# Patient Record
Sex: Female | Born: 1935 | Race: White | Hispanic: No | Marital: Married
Health system: Southern US, Community
[De-identification: ages and names within clinical notes are randomized; demographics above are authoritative.]

---

## 1998-03-24 ENCOUNTER — Ambulatory Visit (HOSPITAL_COMMUNITY): Admission: RE | Admit: 1998-03-24 | Discharge: 1998-03-24 | Payer: Self-pay | Admitting: Orthopedic Surgery

## 1998-03-24 ENCOUNTER — Encounter: Payer: Self-pay | Admitting: Orthopedic Surgery

## 1999-07-14 ENCOUNTER — Other Ambulatory Visit: Admission: RE | Admit: 1999-07-14 | Discharge: 1999-07-14 | Payer: Self-pay | Admitting: Geriatric Medicine

## 1999-09-30 ENCOUNTER — Encounter: Payer: Self-pay | Admitting: Orthopedic Surgery

## 1999-09-30 ENCOUNTER — Encounter: Admission: RE | Admit: 1999-09-30 | Discharge: 1999-09-30 | Payer: Self-pay | Admitting: Internal Medicine

## 1999-10-01 ENCOUNTER — Ambulatory Visit (HOSPITAL_BASED_OUTPATIENT_CLINIC_OR_DEPARTMENT_OTHER): Admission: RE | Admit: 1999-10-01 | Discharge: 1999-10-02 | Payer: Self-pay | Admitting: Orthopedic Surgery

## 1999-10-28 ENCOUNTER — Encounter: Admission: RE | Admit: 1999-10-28 | Discharge: 2000-01-26 | Payer: Self-pay | Admitting: Orthopedic Surgery

## 2000-02-10 ENCOUNTER — Ambulatory Visit (HOSPITAL_BASED_OUTPATIENT_CLINIC_OR_DEPARTMENT_OTHER): Admission: RE | Admit: 2000-02-10 | Discharge: 2000-02-11 | Payer: Self-pay | Admitting: Orthopedic Surgery

## 2000-06-06 ENCOUNTER — Encounter (INDEPENDENT_AMBULATORY_CARE_PROVIDER_SITE_OTHER): Payer: Self-pay | Admitting: *Deleted

## 2000-06-06 ENCOUNTER — Ambulatory Visit (HOSPITAL_COMMUNITY): Admission: RE | Admit: 2000-06-06 | Discharge: 2000-06-06 | Payer: Self-pay | Admitting: Gastroenterology

## 2000-10-17 ENCOUNTER — Encounter: Payer: Self-pay | Admitting: Orthopedic Surgery

## 2000-10-17 ENCOUNTER — Ambulatory Visit (HOSPITAL_COMMUNITY): Admission: RE | Admit: 2000-10-17 | Discharge: 2000-10-17 | Payer: Self-pay | Admitting: Orthopedic Surgery

## 2000-10-31 ENCOUNTER — Ambulatory Visit (HOSPITAL_COMMUNITY): Admission: RE | Admit: 2000-10-31 | Discharge: 2000-10-31 | Payer: Self-pay | Admitting: Orthopedic Surgery

## 2000-10-31 ENCOUNTER — Encounter: Payer: Self-pay | Admitting: Orthopedic Surgery

## 2000-12-19 ENCOUNTER — Encounter: Payer: Self-pay | Admitting: Orthopedic Surgery

## 2000-12-19 ENCOUNTER — Ambulatory Visit (HOSPITAL_COMMUNITY): Admission: RE | Admit: 2000-12-19 | Discharge: 2000-12-19 | Payer: Self-pay | Admitting: Orthopedic Surgery

## 2001-02-06 ENCOUNTER — Inpatient Hospital Stay (HOSPITAL_COMMUNITY): Admission: EM | Admit: 2001-02-06 | Discharge: 2001-02-12 | Payer: Self-pay | Admitting: Emergency Medicine

## 2001-02-06 ENCOUNTER — Encounter: Payer: Self-pay | Admitting: Emergency Medicine

## 2001-02-06 ENCOUNTER — Encounter: Payer: Self-pay | Admitting: Geriatric Medicine

## 2001-07-02 ENCOUNTER — Encounter: Payer: Self-pay | Admitting: Orthopedic Surgery

## 2001-07-02 ENCOUNTER — Ambulatory Visit (HOSPITAL_COMMUNITY): Admission: RE | Admit: 2001-07-02 | Discharge: 2001-07-02 | Payer: Self-pay | Admitting: Orthopedic Surgery

## 2002-03-12 ENCOUNTER — Encounter: Payer: Self-pay | Admitting: Orthopedic Surgery

## 2002-03-12 ENCOUNTER — Ambulatory Visit (HOSPITAL_COMMUNITY): Admission: RE | Admit: 2002-03-12 | Discharge: 2002-03-12 | Payer: Self-pay | Admitting: Orthopedic Surgery

## 2002-03-22 ENCOUNTER — Emergency Department (HOSPITAL_COMMUNITY): Admission: EM | Admit: 2002-03-22 | Discharge: 2002-03-22 | Payer: Self-pay | Admitting: Emergency Medicine

## 2002-03-22 ENCOUNTER — Encounter: Payer: Self-pay | Admitting: Emergency Medicine

## 2002-07-26 ENCOUNTER — Encounter: Payer: Self-pay | Admitting: Orthopedic Surgery

## 2002-07-26 ENCOUNTER — Ambulatory Visit (HOSPITAL_COMMUNITY): Admission: RE | Admit: 2002-07-26 | Discharge: 2002-07-26 | Payer: Self-pay | Admitting: Orthopedic Surgery

## 2002-08-21 ENCOUNTER — Ambulatory Visit (HOSPITAL_COMMUNITY): Admission: RE | Admit: 2002-08-21 | Discharge: 2002-08-21 | Payer: Self-pay | Admitting: Orthopedic Surgery

## 2002-08-21 ENCOUNTER — Encounter: Payer: Self-pay | Admitting: Orthopedic Surgery

## 2002-08-27 ENCOUNTER — Encounter (INDEPENDENT_AMBULATORY_CARE_PROVIDER_SITE_OTHER): Payer: Self-pay | Admitting: Specialist

## 2002-08-27 ENCOUNTER — Inpatient Hospital Stay (HOSPITAL_COMMUNITY): Admission: RE | Admit: 2002-08-27 | Discharge: 2002-08-28 | Payer: Self-pay | Admitting: Orthopedic Surgery

## 2005-05-12 ENCOUNTER — Inpatient Hospital Stay (HOSPITAL_COMMUNITY): Admission: EM | Admit: 2005-05-12 | Discharge: 2005-05-27 | Payer: Self-pay | Admitting: Emergency Medicine

## 2005-05-17 ENCOUNTER — Encounter (INDEPENDENT_AMBULATORY_CARE_PROVIDER_SITE_OTHER): Payer: Self-pay | Admitting: Cardiology

## 2005-10-05 ENCOUNTER — Inpatient Hospital Stay (HOSPITAL_COMMUNITY): Admission: EM | Admit: 2005-10-05 | Discharge: 2005-10-08 | Payer: Self-pay | Admitting: Emergency Medicine

## 2006-06-16 ENCOUNTER — Encounter: Admission: RE | Admit: 2006-06-16 | Discharge: 2006-06-16 | Payer: Self-pay | Admitting: Geriatric Medicine

## 2006-10-08 ENCOUNTER — Ambulatory Visit: Payer: Self-pay | Admitting: Pulmonary Disease

## 2006-10-08 ENCOUNTER — Inpatient Hospital Stay (HOSPITAL_COMMUNITY): Admission: EM | Admit: 2006-10-08 | Discharge: 2006-10-31 | Payer: Self-pay | Admitting: Emergency Medicine

## 2006-10-13 ENCOUNTER — Encounter (INDEPENDENT_AMBULATORY_CARE_PROVIDER_SITE_OTHER): Payer: Self-pay | Admitting: Interventional Radiology

## 2006-10-17 ENCOUNTER — Encounter (INDEPENDENT_AMBULATORY_CARE_PROVIDER_SITE_OTHER): Payer: Self-pay | Admitting: Interventional Radiology

## 2007-01-19 ENCOUNTER — Inpatient Hospital Stay (HOSPITAL_COMMUNITY): Admission: EM | Admit: 2007-01-19 | Discharge: 2007-01-29 | Payer: Self-pay | Admitting: Emergency Medicine

## 2007-01-25 ENCOUNTER — Encounter: Payer: Self-pay | Admitting: Orthopedic Surgery

## 2007-01-26 ENCOUNTER — Ambulatory Visit: Payer: Self-pay | Admitting: Physical Medicine & Rehabilitation

## 2007-02-04 ENCOUNTER — Inpatient Hospital Stay (HOSPITAL_COMMUNITY): Admission: EM | Admit: 2007-02-04 | Discharge: 2007-02-15 | Payer: Self-pay | Admitting: Emergency Medicine

## 2007-02-26 ENCOUNTER — Inpatient Hospital Stay (HOSPITAL_COMMUNITY): Admission: EM | Admit: 2007-02-26 | Discharge: 2007-02-28 | Payer: Self-pay | Admitting: Emergency Medicine

## 2007-02-26 ENCOUNTER — Ambulatory Visit (HOSPITAL_COMMUNITY): Admission: RE | Admit: 2007-02-26 | Discharge: 2007-02-26 | Payer: Self-pay | Admitting: Internal Medicine

## 2007-02-26 ENCOUNTER — Ambulatory Visit: Payer: Self-pay | Admitting: Critical Care Medicine

## 2009-08-12 IMAGING — CR DG ELBOW 2V*R*
2 series · 2 of 2 positions shown · non-contrast
Comparison: none

CLINICAL DATA: Status post fall.  Elbow swelling.  Previous humerus surgery 4 years ago.  
RIGHT ELBOW ? 2 VIEW:

[view not recorded (1 of 2)]
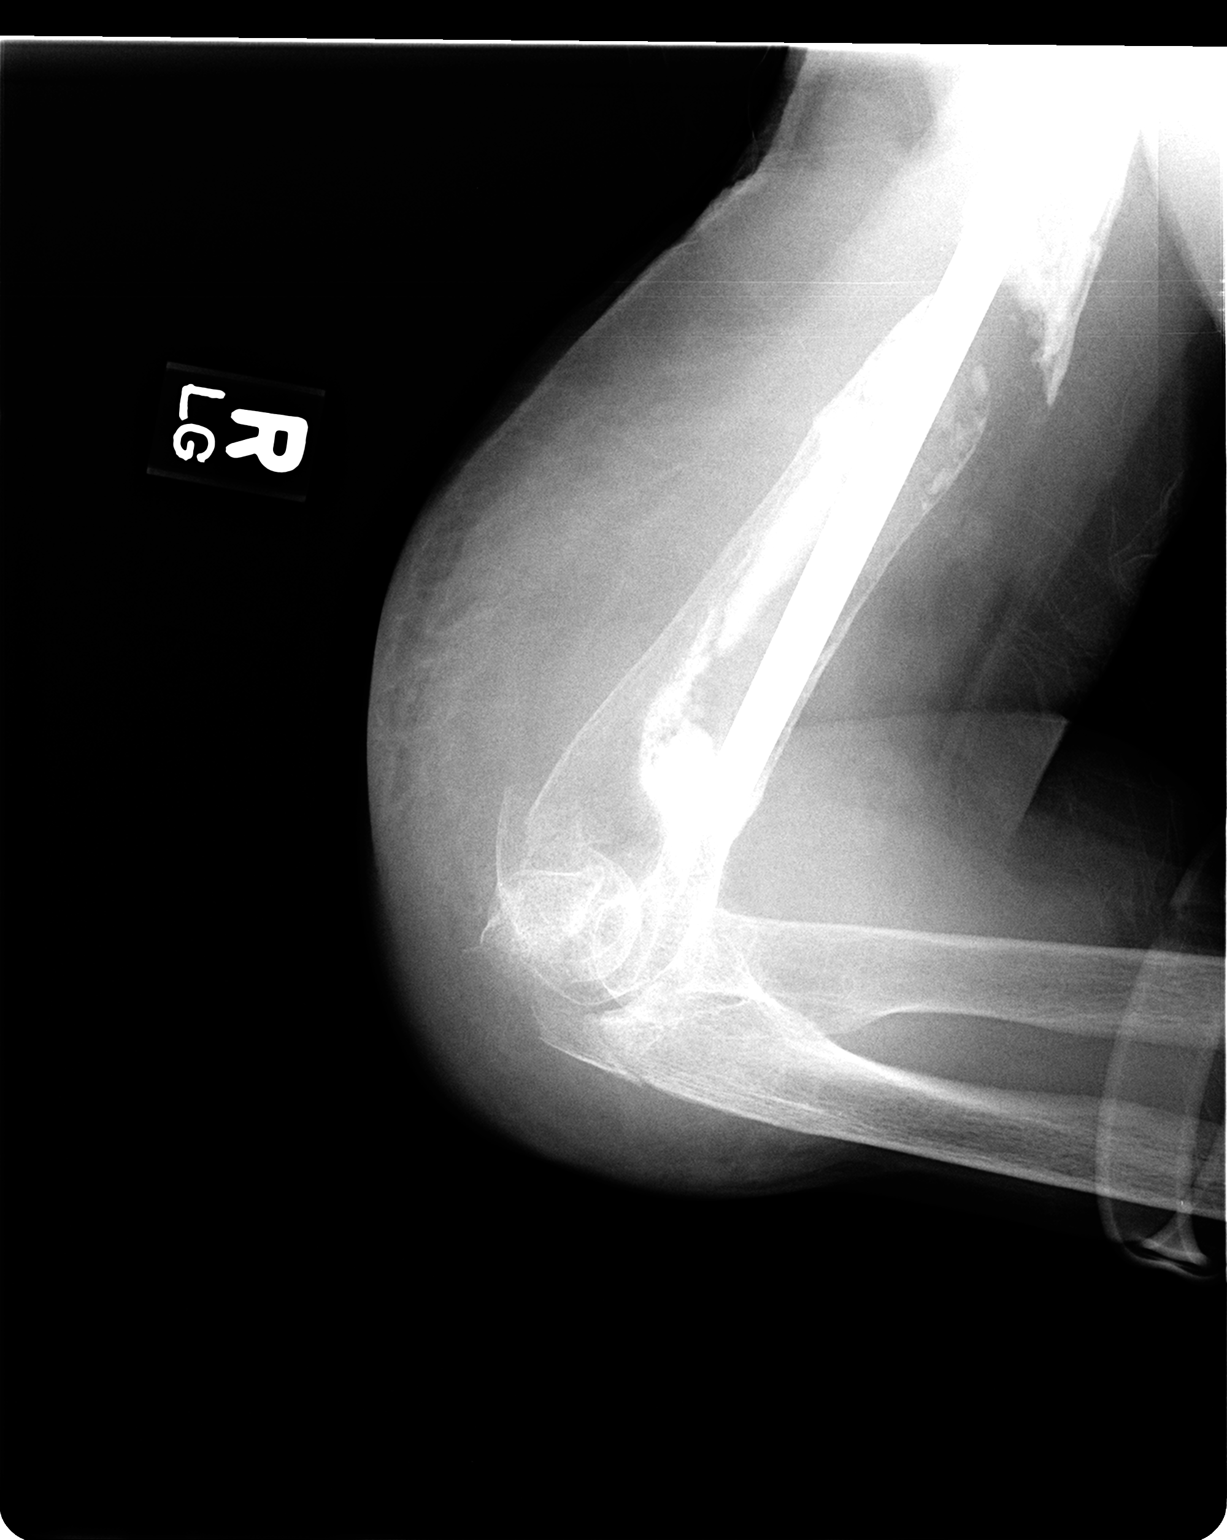

[view not recorded (2 of 2)]
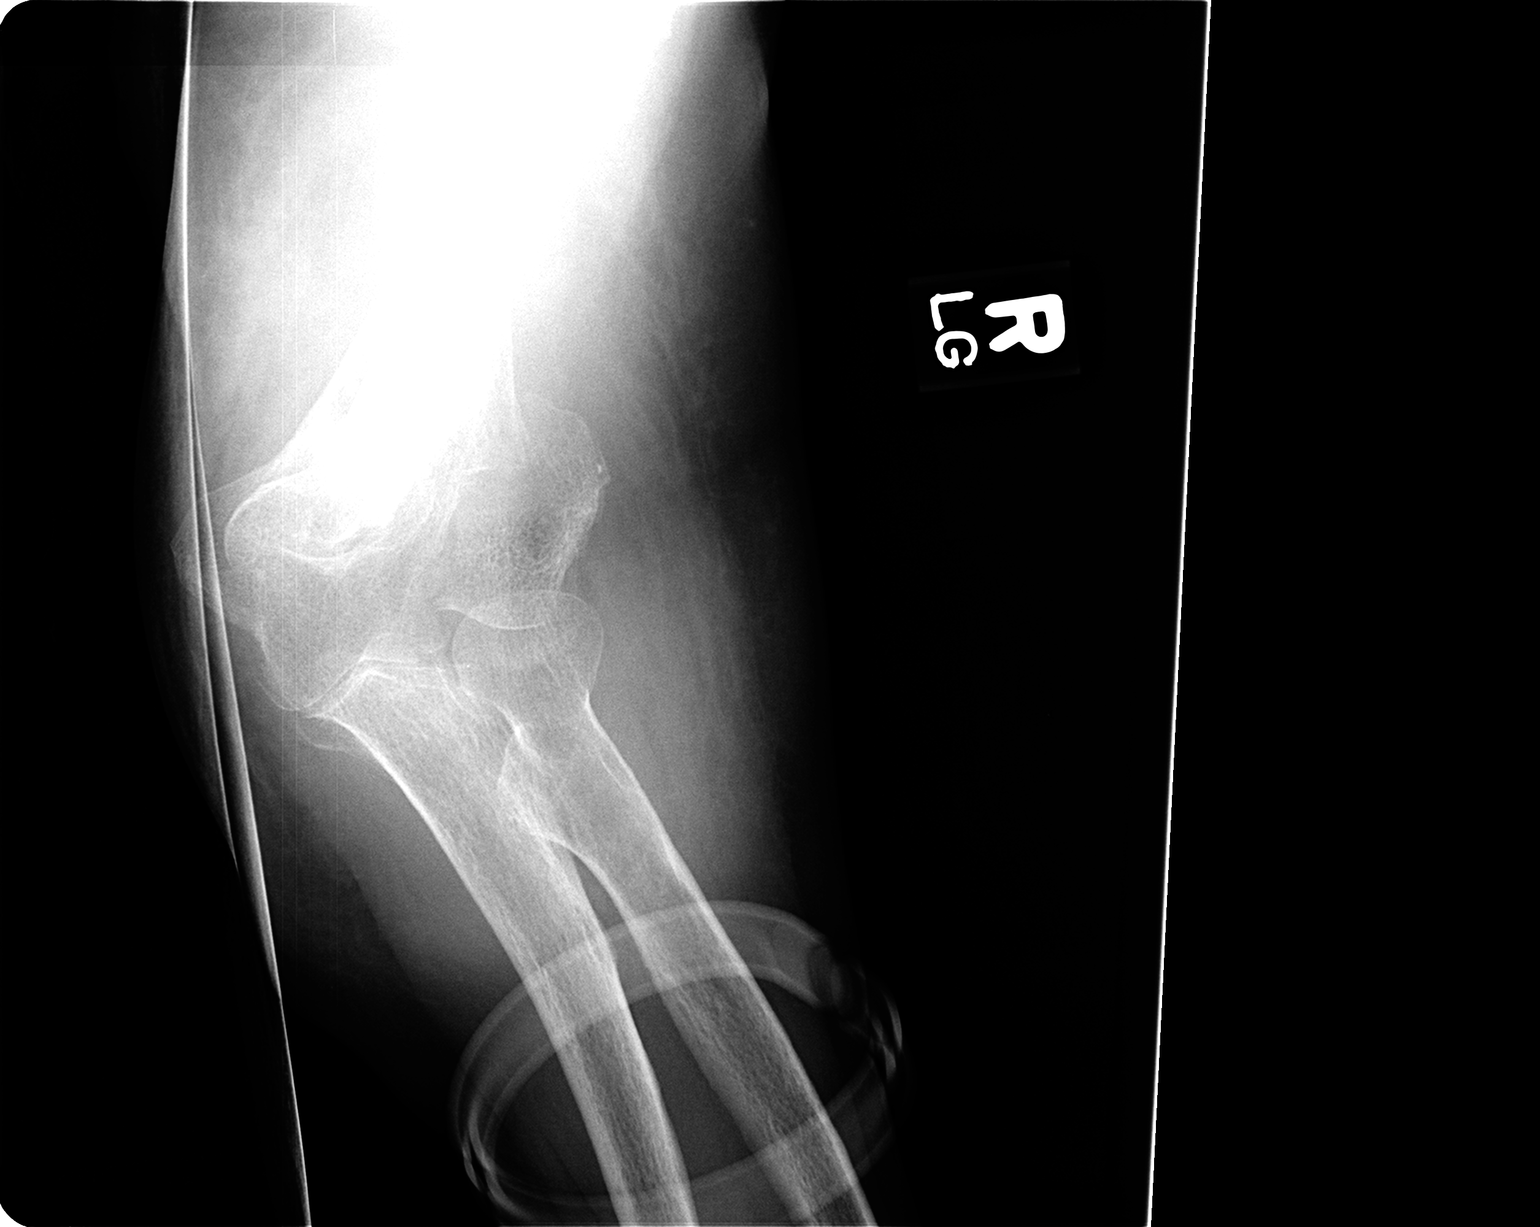

[2 of 2 positions shown; findings below may reference images not displayed]

FINDINGS: The examination is limited by the patient?s injury.  There has been previous right total shoulder replacement.  There is a long humeral shaft extending nearly to the elbow.  An insufficiency or pathologic fracture of the mid humerus is partially imaged.  There is marked osseous lucency surrounding the distal aspect of the humeral prosthesis.  There is also an acute fracture of the distal humerus which is not optimally evaluated, but likely has intraarticular components.  There is also a nondisplaced fracture of the olecranon process of the ulna.  The proximal radius appears intact.  There is marked soft tissue swelling.
IMPRESSION: 1.  Intraarticular fractures of the distal humerus and proximal ulna as described. 
2.  In addition, there is a pathologic fracture of the mid right humerus with marked lysis surrounding the humeral component of the patient?s right total shoulder prosthesis.  
3.  The proximal abnormalities are incompletely imaged on this examination.  Shoulder and humeral radiographs are recommended for complete assessment.

## 2009-08-14 IMAGING — CR DG CHEST 1V PORT
1 series · 1 of 1 positions shown · non-contrast
Comparison: Comparison is made with earlier today.

CLINICAL DATA: PICC line placement.
 PORTABLE CHEST - 1 VIEW ? 01/20/07 AT 2444 HOURS:

[view not recorded]
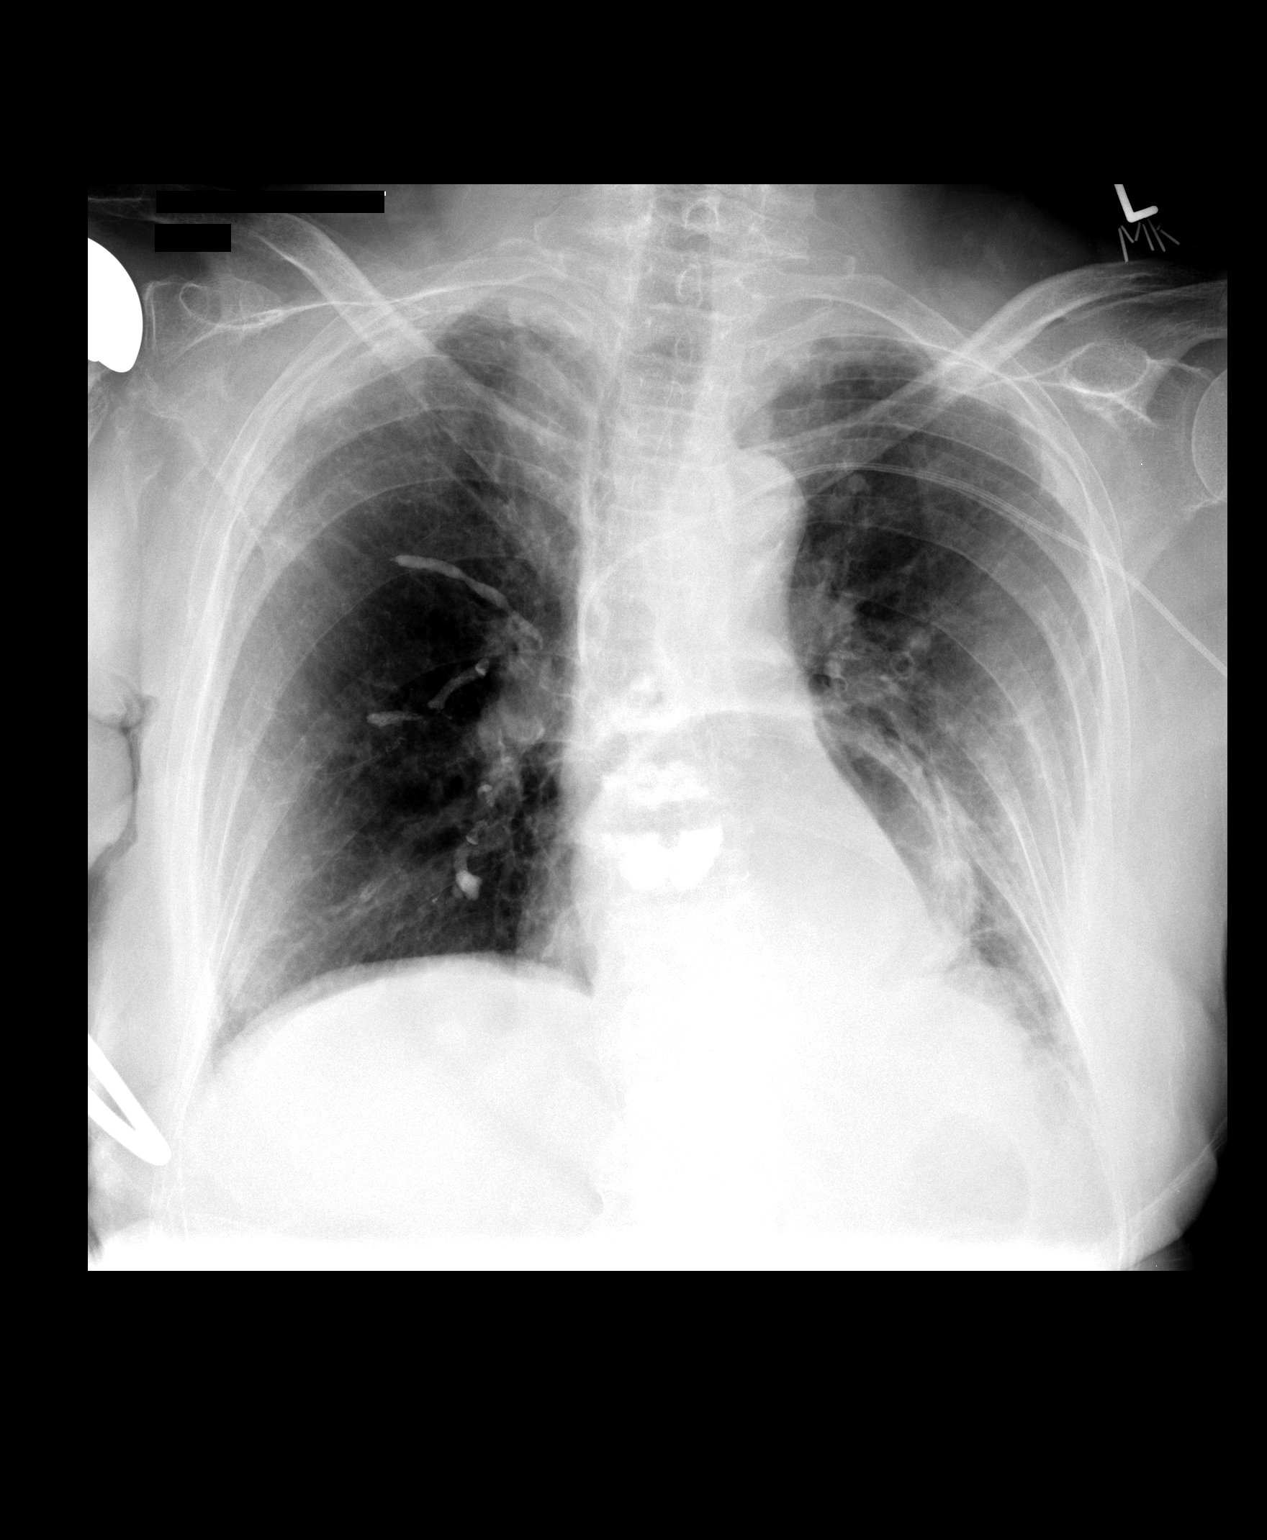

[1 of 1 positions shown; findings below may reference images not displayed]

FINDINGS: The PICC line has been adjusted with the tip now in the SVC in good position.  A large hiatal hernia is noted. The lungs remain clear without acute infiltrate.  There remains some mild left lower lobe atelectasis.
IMPRESSION: The PICC line is now in good position in the SVC.

## 2009-08-15 IMAGING — RF DG ELBOW COMPLETE 3+V*R*
1 series · 5 of 5 positions shown · non-contrast
Comparison: Right humerus x-rays 01/18/2007.

CLINICAL DATA: Right humerus fractures. Previous right shoulder arthroplasty.

INTRAOPERATIVE RIGHT ELBOW - 2 VIEW  01/21/2007:

[Series 1: run · 5 of 5 slices shown]
[im 1/5]
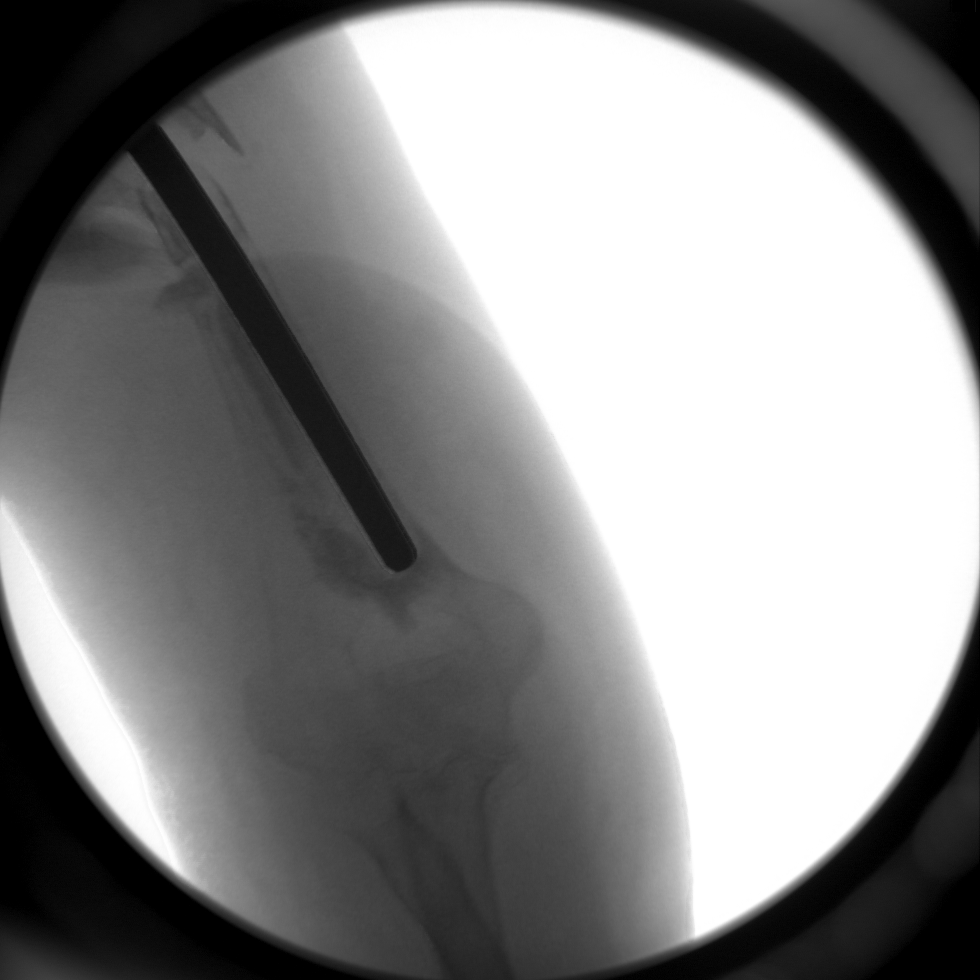
[im 2/5]
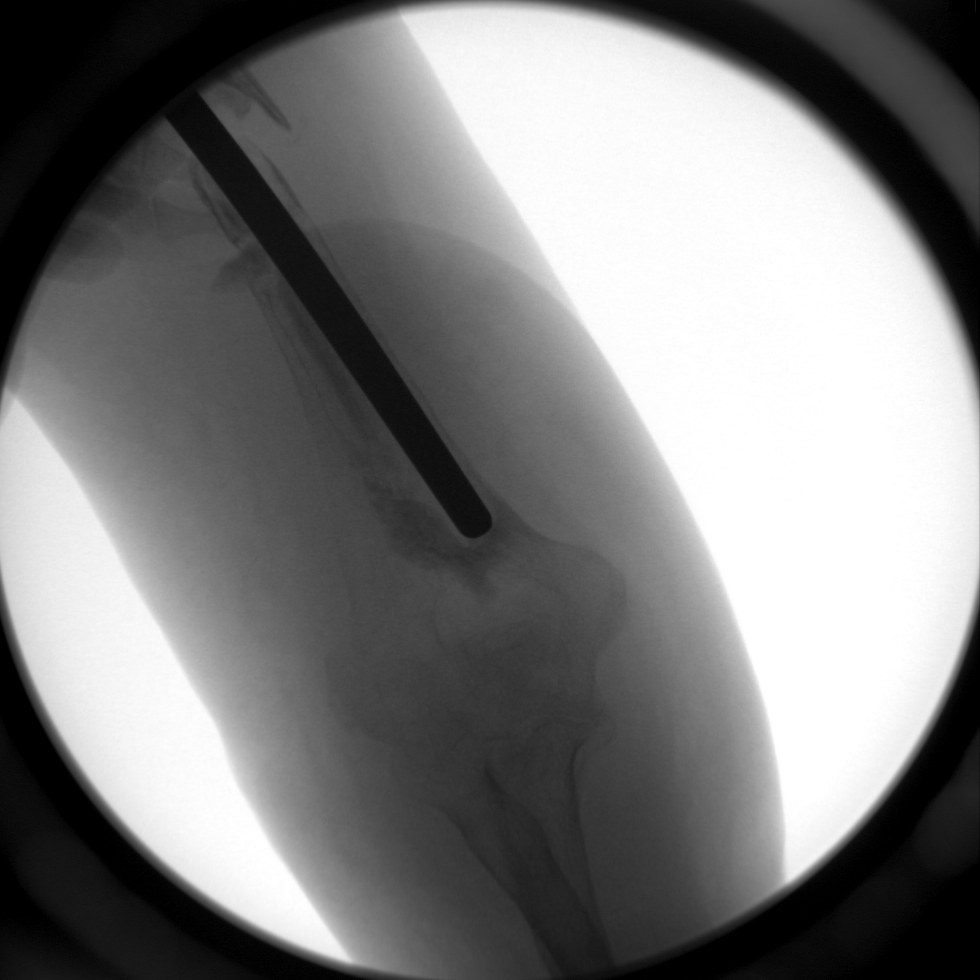
[im 3/5]
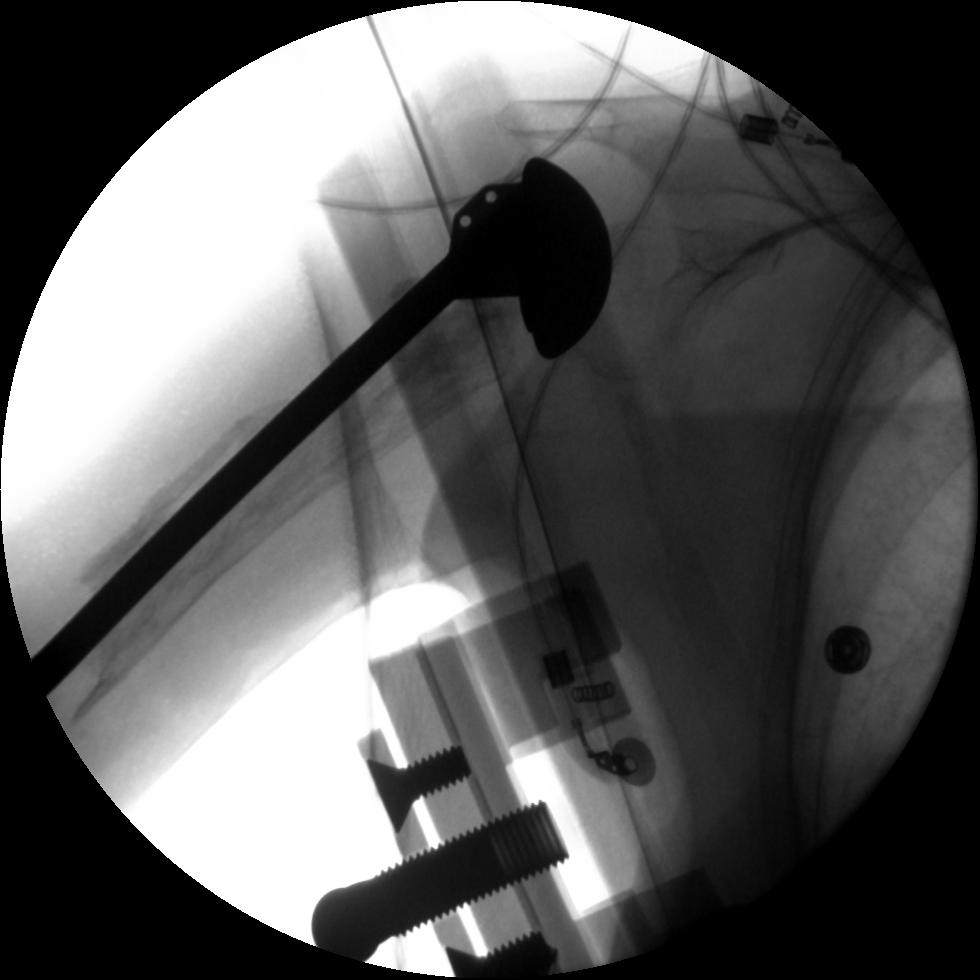
[im 4/5]
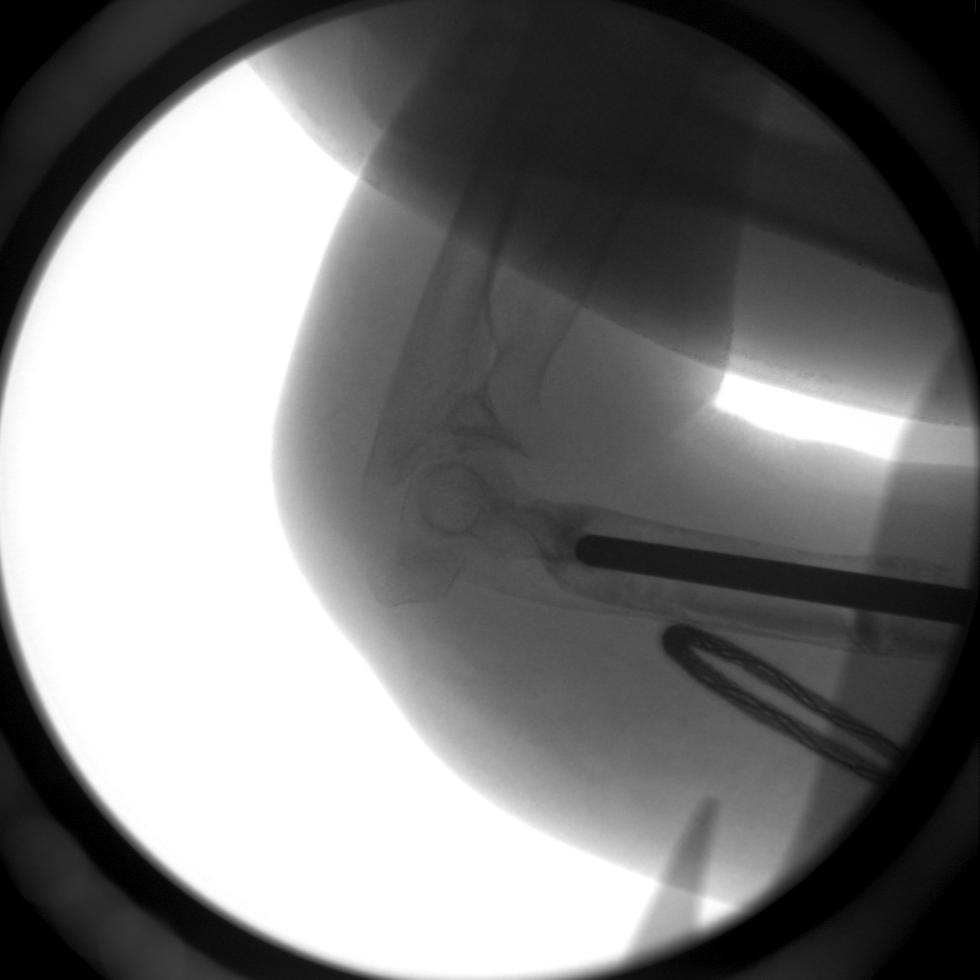
[im 5/5]
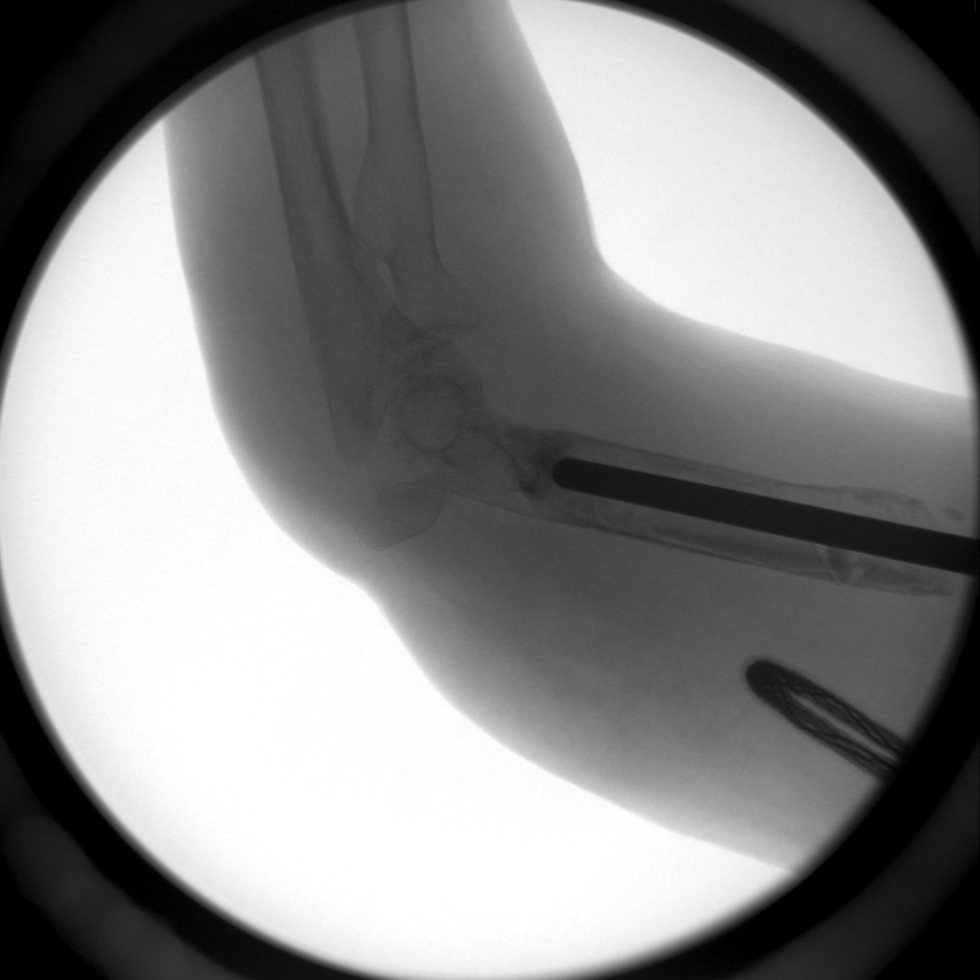

[5 of 5 positions shown; findings below may reference images not displayed]

FINDINGS: Five spot images from the C-arm fluoroscopic device submitted for
interpretation post-operatively. AP and lateral views of the elbow with single
image of the entire humerus demonstrating prior shoulder arthroplasty. Fracture
involving the humerus mid shaft with distraction of the fragments as noted
previously. Olecranon fracture with rotation of the proximal fragment and slight
displacement, the displacement more prominent than on the x-rays 3 days ago.
Distal humerus fracture involving the lateral condyle again noted.
IMPRESSION: Mid shaft humerus fracture, distal humerus fracture involving the lateral
condyle, and olecranon fracture as noted previously. Further distraction of the
olecranon fracture fragments since the exam 3 days ago.

## 2009-08-18 IMAGING — CR DG KNEE 1-2V*R*
2 series · 2 of 2 positions shown · non-contrast
Comparison: none

HISTORY: Right knee pain, recent fall

RIGHT KNEE 2 VIEWS:
Diffuse bony demineralization.
Medial compartment joint space narrowing with perforation.
Soft tissue swelling medially at right knee.
No fracture or dislocation.
Suspect knee joint effusion on slightly rotated lateral view.
Minimal chondrocalcinosis, question CPPD.

[view not recorded (1 of 2)]
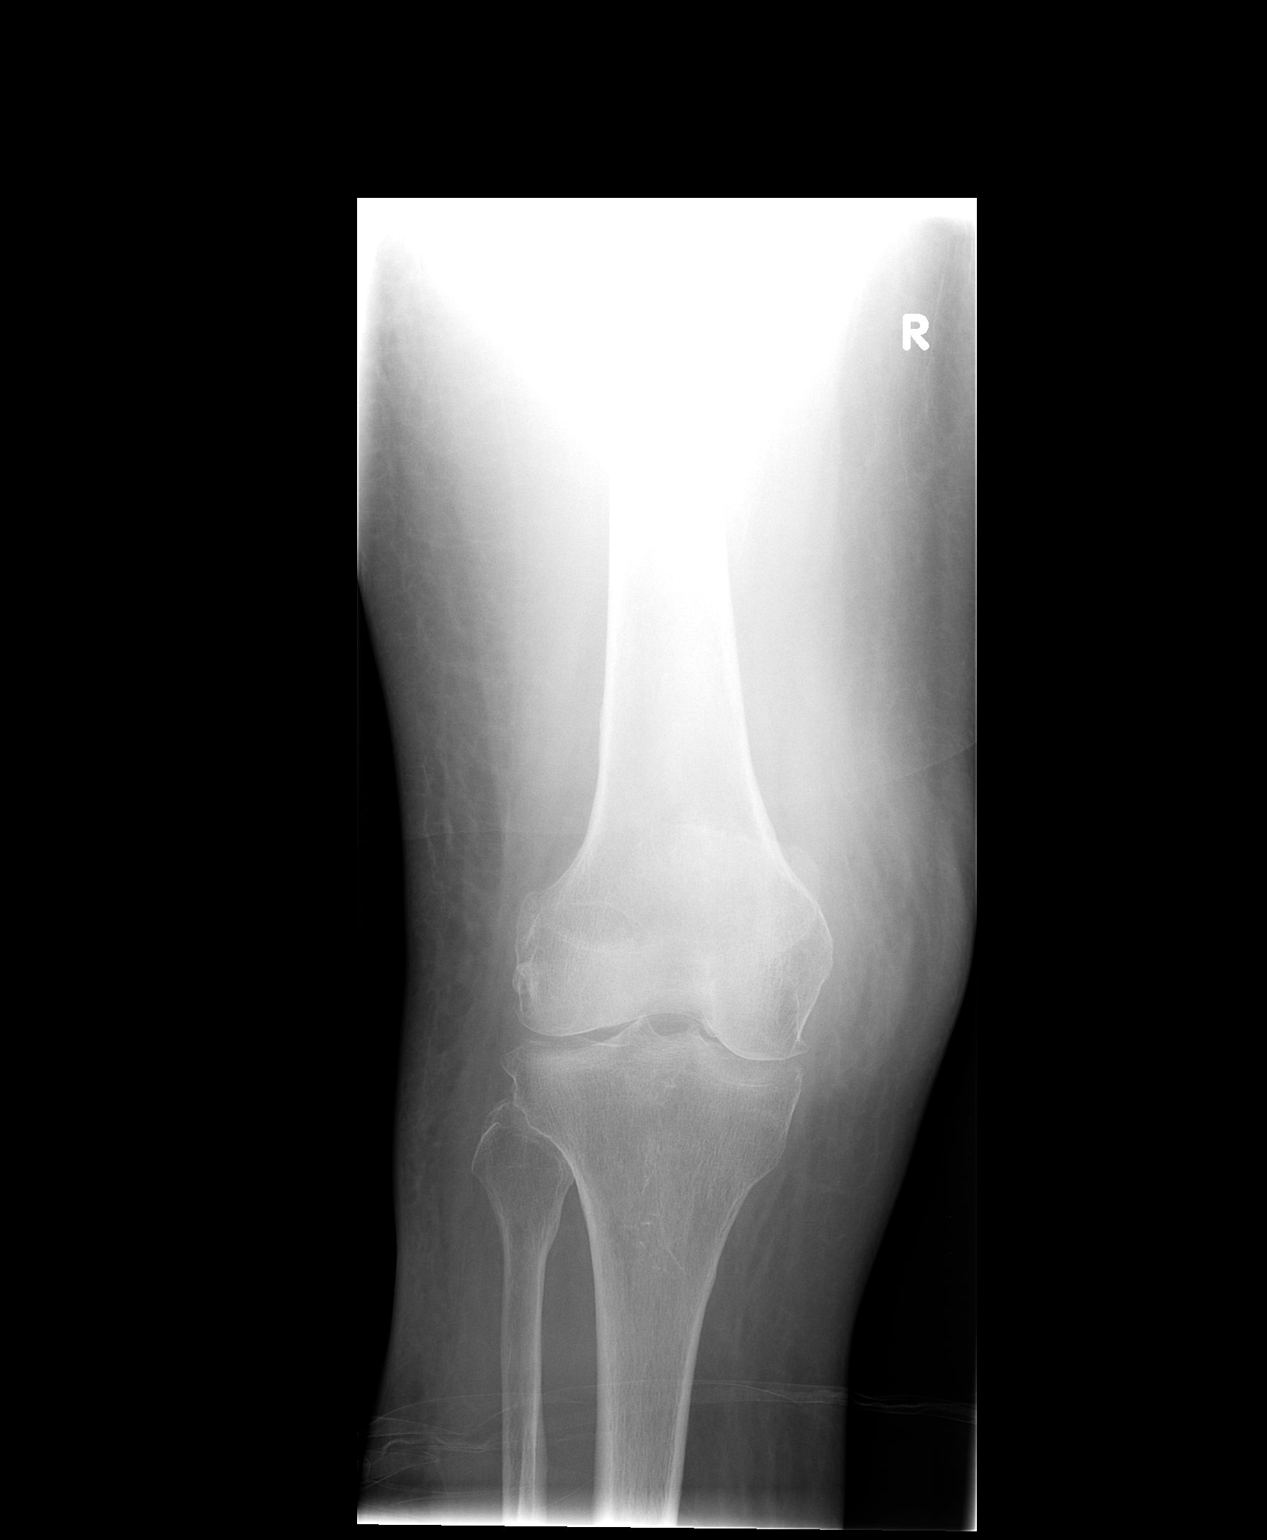

[view not recorded (2 of 2)]
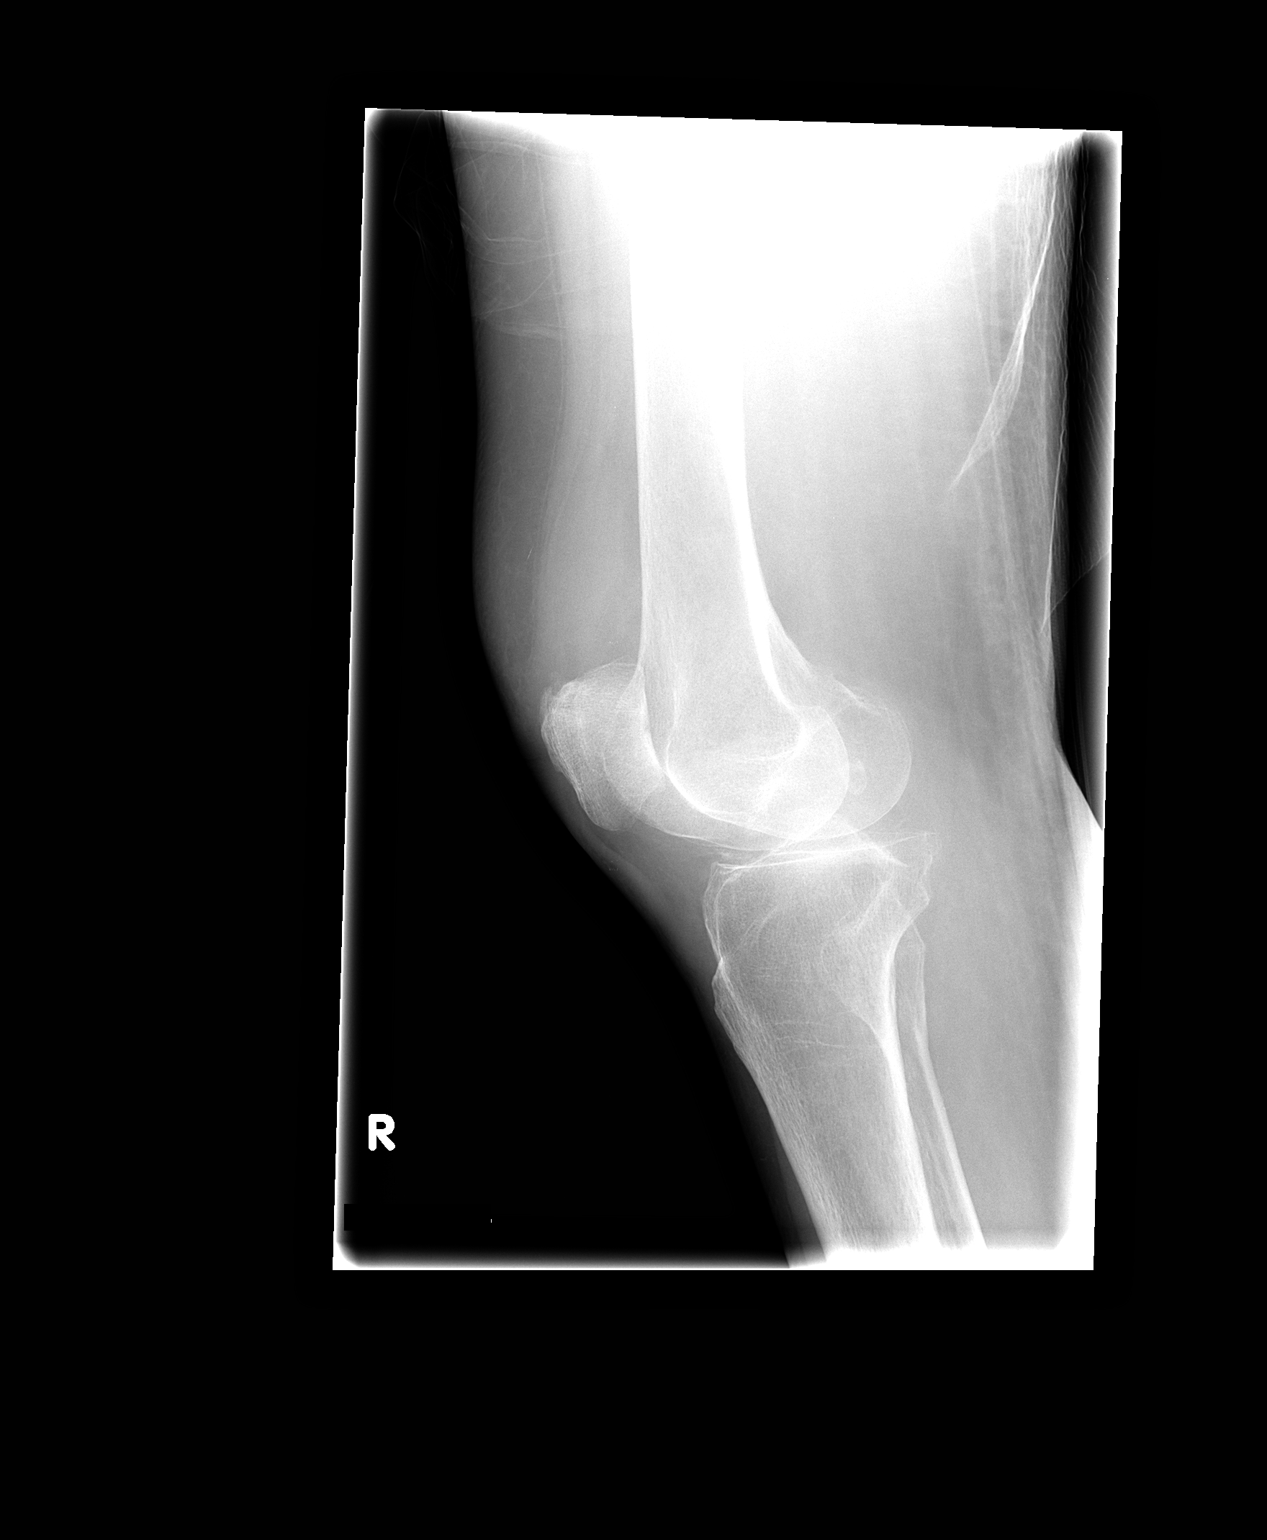

[2 of 2 positions shown; findings below may reference images not displayed]

IMPRESSION: Degenerative changes with probable knee joint effusion.
Diffuse bony demineralization.
No definite acute bony abnormalities.

## 2010-08-17 NOTE — Consult Note (Signed)
NAME:  [name], [name]                 ACCOUNT NO.:  [id number]   MEDICAL RECORD NO.:  [id number]          PATIENT TYPE:  INP   LOCATION:  5020                         FACILITY:  [facility]   PHYSICIAN:  Doralee Albino. Carola Frost, M.D. DATE OF BIRTH:  [birth year]   DATE OF CONSULTATION:  01/22/2007  DATE OF DISCHARGE:                                 CONSULTATION   REQUESTING PHYSICIAN:  Elana Alm. Thurston Hole, M.D.   REASON FOR CONSULTATION REQUEST:  Complex right periarticular elbow  fracture.   BRIEF HISTORY OF PRESENTATION:  [name] [name] is an 77 year old female who  has history of multiple medical problems that sustained a fall from her  rehab facility resulting in right elbow and right hip pain.  She  underwent delayed internal fixation by Dr. Thurston Hole of her hip, and splint  was applied to the elbow.  Given the complexity of the right elbow  situation.  He did ask me to consult regarding evaluation and possible  definitive management.  The patient is status post a right shoulder  fracture dislocation treated with hemiarthroplasty, complicated by a  subsequent fracture the tip of the stem.  This was revised to a long  stem when that fracture developed into a nonunion.  The nonunion did not  resolve, and she developed lucency around the distal prosthesis.  She  sustained a fracture of her lateral condyle in addition to the olecranon  as visualized on the CT scan obtained by Dr. Thurston Hole.  At this time she  denies any numbness or tingling in her fingers, and denies any wrist  pain.   PAST MEDICAL HISTORY:  1. Lower extremity DVT.  2. T12 compression fracture.  3. Hemothorax.  4. Supratherapeutic INR.  5. Paroxysmal SVT.  6. Thrombocytosis.  7. Chronic back pain.  8. Multiple falls.  9. Hypothyroidism.  10.GERD.  11.CVA.  12.Asthma.  13.Osteoporosis.  14.Upper GI bleed.  15.T&A.  16.Hypertension.  17.Status post vertebroplasty.  18.Total abdominal hysterectomy.  19.Exploratory laparotomy.   MEDICATIONS:  Vicodin, metoprolol, calcium, MiraLax, Tylenol, Xopenex,  Coumadin,  Phenergan, Senokot, Prilosec, levothyroxine, simvastatin,  Ativan, morphine ER, gabapentin, and Cymbalta.   SOCIAL HISTORY:  The patient lives at [facility].  Her  husband has Alzheimer's.  She has two sons.   FAMILY MEDICAL HISTORY:  Noncontributory.   REVIEW OF SYSTEMS:  As above.   PHYSICAL EXAMINATION:  GENERAL:  The patient appears to be quite  comfortable.  She is not in any acute distress.  She is conversant.  FOCUSED EXAM:  Right upper extremity notable for a well-healed incision  about the shoulder and humerus.  There is a splint from the humerus down  to the wrist, intact radial, medial, and ulnar sensation.  Intact  radial, medial, and ulnar motor function.  Brisk capillary refill which  is 2 seconds or less.  No significant swelling of the fingers.   X-rays and CT scan were reviewed.  These demonstrated a displaced  lateral condyle fracture, a displaced severely comminuted olecranon  fracture.  There is no dislocation of the ulna-humeral joint at this  time.  There is, again, significant loosening of the distal prosthesis.   ASSESSMENT:  Lateral condyle fracture, and displaced olecranon fracture.   PLAN:  I would recommend internal fixation of the olecranon and lateral  condyle using mini-frag and small-frag construct.  We anticipate  osteoporosis.  There could be jeopardized fixation.  I have discussed  this with the patient and her daughter-in-law well.  I will discuss this  further with the son who is her power of attorney.  If they wish, as Dr.  Thurston Hole has requested, we can proceed with internal fixation tomorrow  given the complexity.  I will be happy to assist with this definitive  management.      Doralee Albino. Carola Frost, M.D.  Electronically Signed     MHH/MEDQ  D:  01/22/2007  T:  01/23/2007  Job:  [id number]

## 2010-08-17 NOTE — Discharge Summary (Signed)
NAME:  [name] [name], [name] [name]                 ACCOUNT NO.:  [id number]   MEDICAL RECORD NO.:  [id number]          PATIENT TYPE:  INP   LOCATION:  2037                         FACILITY:  [facility]   PHYSICIAN:  Barry Dienes. Eloise Harman, M.D.DATE OF BIRTH:  [birth year]   DATE OF ADMISSION:  02/04/2007  DATE OF DISCHARGE:  02/15/2007                               DISCHARGE SUMMARY   PERTINENT FINDINGS:  The patient is an 77 year old white woman who is a  resident of the [facility], who was transferred to  the emergency room due to fever with a temperature greater than 101 and  a systolic blood pressure of 80 mmHg.  She had recently had a fall that  resulted in a right humerus and right proximal femur fracture.  The  fractures were treated with open reduction and internal fixation and she  had been doing reasonably well with the exception of continued chronic  poor food and fluid intake.  She had not had a productive cough,  dysuria, frequency, abdominal pain, diarrhea, nausea, or vomiting.  In  the emergency room, she was treated with IV fluids, oxygen, Rocephin,  and dopamine for severe hypotension.  She also was treated with  adenosine when she had an episode of PSVT, which resulted in successful  conversion to a sinus rhythm.  At the time of initial evaluation, she  was sleepy and unable to provide a reliable history, so much of her  history of present illness was obtained via discussion with her son who  was present, and by review of her extensive medical records.   See the admission history and physical for a description of her past  medical history and medications prior to admission.   ALLERGIES:  PENICILLIN, but not cephalosporins, and DURAGESIC has been  associated with excessive sedation.   INITIAL PHYSICAL EXAM:  VITAL SIGNS:  Blood pressure systolic was 60-80  mmHg with diastolic 36-62 mmHg, temperature 99.2, pulse initially 162  beats per minute, decreased to 73 beats per  minute, respiratory rate 33,  pulse oxygen saturation 97% on 100% non-rebreather oxygen.  GENERAL:  She is a frail, elderly white female who was sleepy and would open her  eyes with exam.  Pupils were equal and round (3 mm in diameter).  Extraocular movements were intact.  NECK:  Supple and without jugular  venous distention or carotid bruit.  CHEST:  Clear to auscultation  anteriorly.  HEART:  Regular rate and rhythm without significant murmur  or gallop.  ABDOMEN:  Normal bowel sounds and no tenderness.  EXTREMITIES:  Right upper extremity showed moderate pitting edema of the  right hand with intact posterior arm incision without purulence or  erythema.  The right hip incision was clean, dry, and intact with  staples in place.  NEUROLOGICAL EXAM:  She was sleepy and would open her  eyes, but would not speak or follow commands.   INITIAL LABORATORY STUDIES:  Serum sodium 134, potassium 3.9, chloride  101, BUN 17, creatinine 0.9, glucose 118, serum lactate 26.  INR 1.6.  Urinalysis:  Specific gravity 1.018 was  nitrite negative.  White blood  cell count 16.1, hemoglobin 10, hematocrit 32.8, platelets 992,000.   A chest x-ray showed consolidation in the left lower lobe with probable  small left-sided pleural effusion.   EKG showed narrow complex tachycardia with a heart rate approximately  155 and no signs of ischemia.   HOSPITAL COURSE:  The patient was admitted to an ICU step-down bed due  to her sepsis syndrome with tachycardia.  She was initially treated with  dopamine to maintain a reasonable blood pressure.  She was tapered off  of the dopamine and given extensive IV fluids and broad-spectrum  antibiotics.  She was also seen by a GI consultant for the possibility  of PEG tube placement.  Unfortunately, she has a large hiatal hernia  with her entire stomach intrathoracic, thus making a PEG tube placement  not an option.  She had an attempt that fluoroscopic-guided Panda tube   placement; however, this was unsuccessful, as the tube would not  progress into the small intestine.  A cardiology consultant evaluated  her and felt that gradually resuming beta blocker treatment would  prevent further episodes of supraventricular tachycardia.  If her PSVT  continued, he recommended evaluation by an electrophysiologist.  She was  also seen by her Orthopedics physician, who removed her right hip  staples and followed her condition in the hospital.  A speech pathology  swallowing study was done on November 3, during which she showed extreme  oropharyngeal weakness with aspiration of both nectar and thin liquids,  both of which were prevented with very small teaspoon-size sips and a  sitting-up position.  A dysphagia III diet with chopped meat and thin  liquids with small teaspoon-size single sips and medications whole in  puree with full supervision was recommended.  Subsequently, she seemed  to do better with her swallowing with nectar-thick consistencies.  She  had some nausea with restarting Cymbalta at 60 mg daily that resolved  with decreasing her dose to 30 mg and adding Remeron.  She was briefly  started on oxandrolone, but this was discontinued as it would likely  complicate Coumadin treatment.  Gradually, her strength improved and she  no longer had episodes of supraventricular tachycardia on full-dose  metoprolol; in addition, her food and fluid consumption improved  dramatically.   COMPLICATIONS:  None   CONDITION ON DISCHARGE:  She was alert and well-oriented.  She had been  eating much better over the past 2-3 days.  She denied a productive  cough and her spirits were improved.  She no longer had any nausea.  She  had some somewhat loose stools.  Most recent physical exam shows  temperature 97, pulse 54, respirations 18, blood pressure 149/70, pulse  oxygen saturation 100% on room air.  White blood cell count was 8.1,  hemoglobin 9.1, hematocrit 27.4,  platelets 683,000.  PT 16.4, INR 1.3.  A urine culture was negative.  The 25 hydroxy vitamin D level was 42.   A November 11 chest x-ray showed increased opacity in the left lung base  consistent with airspace disease and continued to show a very large  hiatal hernia.   Her chest exam shows crackles in the lower left thorax.  Heart has a  regular rate and rhythm without significant murmur or gallop.  Abdomen  has normal bowel sounds and no tenderness.  Extremities were notable for  clean, dry, and intact incisions over the right elbow and right hip  region and bilateral edema in  the elbow region.  There is no evidence of  skin breakdown and the PICC line in the left upper extremity has a  normal appearance.   DISCHARGE DIAGNOSES:  1. Left lower lobe pneumonia, improved.  2. Paroxysmal supraventricular tachycardia.  3. Sepsis syndrome.  4. Hypokalemia, resolved.  5. Anemia.  6. Depression.  7. Severe malnutrition.  8. Large hiatal hernia with gastroesophageal reflux disease.  9. History of left lower extremity deep venous thrombosis.  10.Hypothyroidism.  11.Chronic low back pain.  12.Osteoporosis with multiple compression fractures and      vertebroplasties.  13.Dysphasia.  14.History of upper gastrointestinal bleed.  15.Physical deconditioning.  16.Status post right humerus fracture with surgical repair.  17.Status post right femur fracture with surgical repair.  18.Anxiety.   DISCHARGE MEDICATIONS:  1. Vancomycin 1500 mg IV daily for 2 more days.  2. Rocephin 1 g IV daily for 2 more days.  3. Flush left upper extremity PICC line with saline per facility      protocol.  4. Florastor 250 mg p.o. b.i.d.  5. Metoprolol 100 mg p.o. b.i.d.  6. Cymbalta 30 mg p.o. daily.  7. Megace 400 mg p.o. b.i.d.  8. Lovenox 60 mg subcutaneous q.12 h. until INR test is greater than      2.  9. Coumadin 5 mg p.o. daily, then per pharmacist protocol for left      lower extremity DVT.   10.Remeron 30 mg p.o. nightly.  11.Omeprazole 20 mg p.o. daily.  12.Synthroid 50 mcg p.o. daily.  13.Aldactone 100 mg p.o. b.i.d.  14.Vicodin 5/325 mg one tablet p.o. q.4 h. p.r.n. moderate pain and      two tablets p.o. q.4 h. p.r.n.  15.MiraLax 17 grams p.o. daily.  16.Med Pass supplements at 4 ounces p.o. t.i.d.  17.Nectar-thick liquids and a mechanical soft diet.  18.Iron sulfate 325 mg daily.  19.Zocor 20 mg daily.  20.Ativan 0.5 mg p.o. nightly p.r.n. sleep.  21.Vitamin D 50,000 units p.o. once weekly.  22.Neurontin 300 mg p.o. nightly.  23.Dulcolax 10 mg suppository daily p.r.n. constipation.   DISPOSITION AND FOLLOWUP:  The patient will be transferred back to the  skilled nursing facility today and will be seen by the attending  physician at the facility.  She should have a followup appointment with  Dr. Myrene Galas per his recommendations.  Her Foley catheter can be  removed upon return to the skilled nursing facility.           ______________________________  Barry Dienes. Eloise Harman, M.D.     DGP/MEDQ  D:  02/15/2007  T:  02/15/2007  Job:  [id number]   cc:   Doralee Albino. Carola Frost, M.D.  James L. Malon Kindle., M.D.  Francisca December, M.D.

## 2010-08-17 NOTE — H&P (Signed)
NAME:  [name], [name]                 ACCOUNT NO.:  [id number]   MEDICAL RECORD NO.:  [id number]          PATIENT TYPE:  INP   LOCATION:  5004                         FACILITY:  [facility]   PHYSICIAN:  Nelda Bucks, MD DATE OF BIRTH:  10-28-1923   DATE OF ADMISSION:  02/26/2007  DATE OF DISCHARGE:                              HISTORY & PHYSICAL   CHIEF COMPLAINT:  Fevers, hypotension.   HISTORY OF PRESENT ILLNESS:  This is an 77 year old female recently  treated for pneumonia and found to be febrile to 102 and hypotensive  while getting her PICC line placed this afternoon.  She was also short  of breath at this time.  Please note the patient was septic during  recent admission for pneumonia and discharged recently November 13 with  two additional days of vancomycin.   Over the past 2 days, she has had increased weakness, and then became  dyspneic early  this afternoon.  She was brought to the emergency room  for pulmonary assessment to help with prognosis and to treat her fevers.  Per her family since this summer, she has had a gradual decline in  overall health and limited mobility.  This initially started with a  vertebral fracture followed by hip and arm fracture that was complicated  by postoperative pneumonia with sepsis.  The patient has never regained  her previous strength nor previous baseline status.  She was discharged  from hospital to skilled nursing facility and was receiving some  physical therapy and improved mildly, although did not achieve her  prehospitalization status.   The son states that the patient states she is tired of trying to get  better.  He states she has a living will and does not want intubation  or CPR.  The sons are not even sure if antibiotics will help as they  feel it may prolong her demise and cause her suffering. The sons say the  patient went to two doctor's appointments today and was without  incidence at either one of those.  The  patient's shortness of breath has  continued to worsen as the evening went along and her pressures continue  to be low.   PAST MEDICAL HISTORY:  Recent hospitalization for left lower lobe  pneumonia with septic shock, SVT,  left lower extremity DVT,  hypothyroid, chronic low back pain, osteoporosis with compression  fracture, dysphagia with aspiration, anxiety, status post a right femur  and humerus ORIF.   MEDICATIONS:  Florastor 250 b.i.d., metoprolol 100 b.i.d., Cymbalta 30  b.i.d., Megace 400 b.i.d., Coumadin 5 daily, Remeron 30 q.h.s.,  omeprazole, Synthroid 50 mcg daily, aldactone 100 b.i.d., Vicodin 5/325  p.r.n., MiraLax, iron sulfate, Zocor 20 mg daily, Ativan 0.5 mg q.h.s.  p.r.n., vitamin D, Neurontin 300 mg p.o. daily, Dulcolax.   SURGICAL HISTORY:  Bilateral reduction mammoplasties, total abdominal  hysterectomy with salpingo-oophorectomy, small bowel obstruction, lysis  of adhesions, right shoulder fracture with ORIF, right humerus fracture  with ORIF, and right hip fracture ORIF, and vertebroplasties.   SOCIAL HISTORY:  Lives in a skilled nursing facility, denies alcohol,  denies smoking.   FAMILY HISTORY:  Breast cancer in the mother and sister, coronary artery  disease in the brother.   ALLERGIES:  ALLERGIC TO PENICILLIN, HOWEVER, NOT CEPHALOSPORINS.  ALSO  ALLERGIC TO DURAGESIC.   REVIEW OF SYSTEMS:  Unable to obtain.   PHYSICAL EXAM:  VITAL SIGNS:  Heart rate in the 130s, temperature 103.4,  blood pressure 103/74, respiratory rate 30, O2 sat 85-95% on 100% non-  rebreather Venti mask.  GENERAL:  Tachypneic, ill appearing.  HEENT:  Pupils equal, round, react to light.  Dry mucous membranes.  NECK:  Neck supple.  No lymphadenopathy.  CARDIOVASCULAR:  Tachycardia.  Regular rhythm.  No murmurs, rubs, or  gallops.  PULMONARY:  Decreased breath sounds left lower lobe, rhonchi  bilaterally.  No wheezes.  ABDOMEN: Soft, nontender, positive bowel sounds.   EXTREMITIES: DP pulses are not palpable.  Extremities are warm.  There  is no edema.  NEUROLOGICAL:  Exam alert, follows commands, oriented to person, unable  to answer all questions appropriately.  MUSCULOSKELETAL:  Unable to assess.  SKIN:  Exam no lesion.   LABORATORY DATA:  ABG:  pH 7.411, CO2 20.3, O2 196, bicarb 12.9 on 100%  saturations.  Sodium 132, potassium 3.1, chloride 101, bicarb 15, BUN  21, creatinine 1.46, 97 glucose, AST 38, ALT 27, total bilirubin 0.9,  albumin 2.5.  D-dimer 3.95, INR 2.6, BNP 334.  Urinalysis shows positive  esterase, no nitrite, turbid in color.  White blood cell count elevated  29.7, hemoglobin 12.2, hematocrit 35.8, platelets 552 with 96%  neutrophils.  Chest x-ray shows improved left lower lobe pneumonia.  EKG  is sinus tachycardia.   ASSESSMENT/PLAN:  This is an 77 year old female in septic shock.  1. Neurological:  Altered mental status is likely secondary to      respiratory distress.  Will not use sedation.  Will use a morphine      drip for comfort care.  2. Pulmonary:  Respiratory distress.  The patient is DNI and the      family declined BiPAP.  We will continue the Ventimask.  The goal      is comfort care.  We will also treat the pneumonia and shock, which      is the underlying etiology of this respiratory distress.  Will send      sputum cultures and Gram's stain.  3. Cardiovascular:  SVT.  We will hold home meds.  Blood pressure      cannot tolerate a beta blocker.  No pressors.  No CPR or      cardioversion.  We are going to treat the underlying sepsis and      hope the heart rate improves with this.  4. Infectious Disease.  Empiric vancomycin, Cipro, Elita Quick.  Will      follow CBC.  Pan culture and follow clinically.  Family is okay      with antibiotics for now; however, we will need to address this at      some point.  We can narrow the spectrum of antibiotics if there is      no growth on her blood cultures within 48 hours.  The  patient has      had a new PICC line so this does not need to be changed.  5. Renal:  Acute renal failure.  Given IV fluids with potassium which      will also help replete her potassium which is low.  We are going to  use normal saline to help correct her hyponatremia.  Follow BMP in      the morning.  6. GI:  PPI, n.p.o., IV fluids.  7. Hematology:  The patient has a known DVT.  We are going to continue      her home Coumadin and follow her CBCs.  8. Code status:  The patient is a no code.  There was a lengthy      discussion with the family and sons who do not want to even pursue      BiPAP.  They also question the use of antibiotics, however have      agreed to them at this      point to see how her prognosis changes.  The main concern is for      comfort, and we will continue a morphine drip to help with      discomfort.  Again she is a DNR/DNI.  I attempted to ask the      patient questions regarding her own intubation; however, her mental      status was too altered for her to adequately assess the tissues.      Johney Maine, M.D.  Electronically Signed      Nelda Bucks, MD  Electronically Signed    JT/MEDQ  D:  02/27/2007  T:  02/27/2007  Job:  [id number]   cc:   Nelda Bucks, MD

## 2010-08-17 NOTE — Consult Note (Signed)
NAME:  Ferran, Draven                 ACCOUNT NO.:  [id number]   MEDICAL RECORD NO.:  [id number]          PATIENT TYPE:  INP   LOCATION:  2918                         FACILITY:  [facility]   PHYSICIAN:  James L. Randa Evens, M.D. DATE OF BIRTH:  [birth year]   DATE OF CONSULTATION:  02/09/2007  DATE OF DISCHARGE:                                 CONSULTATION   We were asked to see Ms. Morici today in consultation for possible PEG  tube placement by Dr. Jarome Matin.   This is an 77 year old female with a recent right shoulder and right hip  fracture and repair who is currently on Coumadin for DVT therapy.  This  hospitalization she has been experiencing aspiration and decrease in  appetite since her recent orthopedic surgeries on January 21, 2007, as  well as January 25, 2007.  She has also been experiencing pneumonia with  sepsis, episodes of PSVT.  She has been hypokalemic and she is severely  malnourished.  An attempt was made to place a Panda tube under  fluoroscopy but it failed due to a large hiatal hernia.  The patient  tells me that she has had a small-bowel obstruction and surgical repair.  She has also had a total hysterectomy.  Both she and her son are  comfortable with moving forward with PEG placement.   PAST MEDICAL HISTORY:  Significant for:  1. CVA.  2. Hypothyroidism.  3. Hypertension.  4. Asthma.  5. Chronic pain.  6. Severe osteoporosis with multiple fractures and repairs.  7. GERD.  8. History of upper GI bleed.  9. Small-bowel obstruction  10.Depression.  11.Most recently a DVT.   Her primary care physician is Dr. Merlene Laughter.   CURRENT MEDICATIONS FROM HOME:  Include Vicodin, Phenergan, MiraLax,  Zocor, omeprazole, MS Contin, Nu-Iron, Ativan, Cymbalta, a multivitamin,  Neurontin, Lopressor, Coumadin, Cipro, Synthroid, Remeron, and Mirapex.   The patient has allergies to FENTANYL and PENICILLIN.   SOCIAL HISTORY:  She lives at [facility] nursing facility.   PHYSICAL EXAMINATION:  She is alert and oriented.  She is very pleasant  to speak with but speaks slowly.  Her temperature is 98.0, pulse 81,  respirations are 18, blood pressure is 158/70.  HEART:  Is currently in sinus rhythm.  LUNGS:  Clear anteriorly.  Her abdomen is soft, nondistended with good  bowel sounds, nontender.  She has a well-healed lower central scar.   LABORATORIES:  Show a BMET significant for a 2.6 potassium.  Her BUN and  creatinine are within normal limits.  PT is currently 30.1.  INR is 2.8.  Hemoglobin 8.7; hematocrit 26.2; white count 12.9; platelets 718,000.   ASSESSMENT:  Dr. Carman Ching has seen and examined the patient,  collected a history.  His impression is that this is an 77 year old  female on Coumadin for deep vein thrombosis who has recently experienced  pneumonia with sepsis, a series of paroxysmal supraventricular  tachycardia, right shoulder and hip fracture, some aspiration on  modified barium swallow with severe malnutrition.  PEG was recommended  for long-term nutrition.  Both the patient and  her POA, who is her son,  Jesusita Oka, are okay with moving forward with the PEG.   Plan is for PEG placement on Monday by Dr. Bernette Redbird.  Will hold  her Coumadin and start her on heparin when her INR is less 2.  Discontinue p.o. at midnight Sunday evening except for medications.  Gather permit for PEG placement.  The patient is currently on vancomycin  and Rocephin so additional antibiotic coverage should not be necessary  for PEG placement on Monday.      Stephani Police, PA    ______________________________  Llana Aliment Randa Evens, M.D.    MLY/MEDQ  D:  02/09/2007  T:  02/09/2007  Job:  [id number]   cc:   Hal T. Stoneking, M.D.  Bernette Redbird, M.D.

## 2010-08-17 NOTE — Op Note (Signed)
NAME:  [name], [name]                 ACCOUNT NO.:  [id number]   MEDICAL RECORD NO.:  [id number]          PATIENT TYPE:  INP   LOCATION:  5020                         FACILITY:  [facility]   PHYSICIAN:  Doralee Albino. Carola Frost, M.D. DATE OF BIRTH:  January 26, 1924   DATE OF PROCEDURE:  01/25/2007  DATE OF DISCHARGE:                               OPERATIVE REPORT   PREOPERATIVE DIAGNOSES:  1. Displaced right lateral condyle fracture of the distal humerus.  2. Displaced comminuted olecranon fracture.   POSTOPERATIVE DIAGNOSES:  1. Displaced right lateral condyle fracture of the distal humerus.  2. Displaced comminuted olecranon fracture.   OPERATION/PROCEDURE:  1. Open reduction and internal fixation of right distal humerus.  2. Open reduction and internal fixation of comminuted olecranon      fracture.   SURGEON:  Doralee Albino. Carola Frost, M.D.   ASSISTANT:  Mearl Latin, [place].   ANESTHESIA:  General.   COMPLICATIONS:  None.   TOTAL TOURNIQUET TIME:  2 hours 4 minutes.   ESTIMATED BLOOD LOSS:  150 mL.   DISPOSITION:  To PACU.   CONDITION:  Stable.   BRIEF SUMMARY AND INDICATIONS FOR PROCEDURE:  [name] [name] is an 77-year-  old, right-hand dominant female who his status post comminuted proximal  humerus fracture, treated hemiarthroplasty,  subsequent periprosthetic  fracture treated by plating and then revision long stem with persistent  malunion and osteolysis around the distal aspect of the prosthesis.  She  was in regular state of health when she fell on October 17.  Since that  time she had been immobilized in a splint.  The family was debating on  transfer back to [facility], M.D. who performed her most recent shoulder  surgery, but he was unavailable and elected to stay at this facility.  Dr. Fredric Mare asked me to see her regarding the complex intra-articular,  periarticular elbow fracture.  I discussed very frankly with the son the  risk and benefits of surgery including possibility of  infection,  malunion, nonunion, failure to maintain reduction, particularly given  her severely osteoporotic bone and osteolysis about the prosthesis, the  need to subsequently address the prosthesis should she develop a  fracture, loss of motion, pain and general complications such as heart  attack, stroke, DVT.  After full discussion, he wished to proceed.   DESCRIPTION OF PROCEDURE:  [name] was administered preop  antibiotics, taken operating room where general anesthesia was induced.  She was positioned with the right side up in the lateral position. The  right arm was draped over a towel bolster.  We made a standard posterior  incision and approach to the olecranon and distal humerus.  We were  unable to satisfactorily control her bleeding and despite a considerable  effort and because of this, we then did eventually choose to elevate the  tourniquet.  We elevated the arm, exsanguinated with the Esmarch bandage  and then elevated the tourniquet to 275 mmHg.  It would remain inflated  for 2 hours and 4 minutes.  We made an incision in the triceps fascia  and cleaned out the  fracture sites, obtained provisional reduction with  K-wires and large tenaculum and then placed the Synthes lateral column  plate, obtaining excellent purchase and fixation to the trochlea with  the lateral screws.  We  obtained purchase proximally with one standard  and two locked, and then the condylar segment itself with five locked.   We then turned our attention to the olecranon where similarly a hematoma  was removed with curettage and lavage.  We had obtained provisional  reduction with K-wires and tenaculum, and then applied the Synthes  anatomic plating. Again the bone quality was  exceedingly poor.  We were  able to obtain solid fixation by using the angled locked screws and into  the shaft.  We placed one locked up into the coronoid after first  compressing distally with one standard screw.  We  then placed a few more  locked screws into the shaft.  An AP and lateral fluoroscopic images  confirmed appropriate reduction and hardware placement.  Wound was  copiously irrigated and closed in standard layered fashion using 0  Vicryl, 2-0 Vicryl and 3-0 nylon interrupted sutures for the skin.  Should be noted that the skin was quite ecchymotic and the fat tissues  in general extremely edematous from injury.  There was no significant  distal edema; rather was really loculated in the area of the elbow up.  Sterile gently compressive dressing was applied, followed by the  posterior splint.  The patient was awakened from anesthesia and  transported to the PACU in stable condition.   PROGNOSIS:  [name] has had a severe injury with underlying bone  fragility and osteolysis at the distal extent of her long stem  hemiarthroplasty.  These clearly increase her risk of complications.  We  tried to begin range of motion as soon as possible so that she can  regain and maintain as much elbow motion as she can.  This will be only  after the wound is stable, which will likely require about 2 weeks.      Doralee Albino. Carola Frost, M.D.  Electronically Signed     MHH/MEDQ  D:  01/25/2007  T:  01/26/2007  Job:  [id number]

## 2010-08-17 NOTE — H&P (Signed)
NAME:  [name] [name], [name] [name]                 ACCOUNT NO.:  [id number]   MEDICAL RECORD NO.:  [id number]          PATIENT TYPE:  INP   LOCATION:  2918                         FACILITY:  [facility]   PHYSICIAN:  Barry Dienes. Eloise Harman, M.D.DATE OF BIRTH:  1924/03/15   DATE OF ADMISSION:  02/04/2007  DATE OF DISCHARGE:                              HISTORY & PHYSICAL   CHIEF COMPLAINT:  Fever with weakness.   HISTORY OF PRESENT ILLNESS:  The patient is a 77 year old white woman  who is a resident of the [facility], and who was  transferred to the emergency room due to fever with a temperature  greater than 101 and a systolic blood pressure of 80 mmHg.  She recently  had a fall that resulted in a right humerus and right proximal femur  fracture.  The right hip fracture underwent successful open reduction  and internal fixation as did the right humerus fracture.  She had been  doing reasonably well with exception of continued chronic poor food and  fluid intake until the day of hospital admission.  Specifically, she had  not had a productive cough, dysuria, frequency, abdominal pain,  diarrhea, nausea, or vomiting.  In the emergency room she was treated  with IV fluids, oxygen, Rocephin, and dopamine for severe hypotension.   Currently, she is very sleepy and unable to provide a reliable history,  so much of her history of present illness was obtained via discussion  with her son who was present, and by review of her extensive medical  records.   PAST MEDICAL HISTORY:  1. Postoperative anemia status post blood transfusions.  2. Hospital-acquired left lower extremity DVT in October 2008.  3. Acute T12 compression fracture in the summer 2008.  4. Osteoporosis with a history of four vertebroplasties in the      thoracic spine.  5. A right-sided bloody pleural effusion.  6. Coagulopathy.  7. Upper GI bleed.  8. Chronic low back pain.  9. Physical deconditioning.  10.History of right  shoulder fracture with surgical repair.  11.Depression.  12.Hypothyroidism.  13.Paroxysmal atrial tachycardia.  14.Insomnia.  15.Malnutrition.   MEDICATIONS PRIOR TO ADMISSION:  1. Vicodin 5/500 1 tablet p.o. q.4h. p.r.n. pain.  2. Phenergan 12.5 mg p.o. t.i.d. p.r.n. nausea.  3. Avelox 400 mg p.o. daily, that was just started for fever.  4. Tylenol 650 mg p.o. every p.m.  5. MiraLax 17 grams p.o. daily, hold for diarrhea.  6. Zocor 20 mg daily.  7. Omeprazole 40 mg daily.  8. MS Contin 30 mg p.o. b.i.d.  9. Nu-Iron 150 one tablet daily.  10.Ativan 0.5 mg p.o. nightly.  11.Cymbalta 60 mg p.o. daily.  12.Vitamin D 50,000 units p.o. once weekly.  13.Neurontin 800 mg p.o. b.i.d.  14.Metoprolol 100 mg p.o. b.i.d.  15.Colace 100 mg p.o. b.i.d.  16.Ensure pudding four times daily.  17.Mirapex 0.5 mg p.o. nightly p.r.n. restless legs.  18.Remeron 15 mg p.o. q.p.m.  19.Dulcolax 10 mg suppository p.r.n. constipation.  20.Coumadin 5 mg p.o. daily.  21.Senokot two tablets p.o. daily.  22.Synthroid 50 mcg p.o. daily.  ALLERGIES:  PENICILLIN (and not cephalosporins), DURAGESIC (excessive  sedation).   PAST SURGICAL HISTORY:  1. Bilateral reduction mammoplasties.  2. Total abdominal hysterectomy with bilateral salpingo-oophorectomy.  3. Small-bowel obstruction lysis of adhesions.  4. Right shoulder fracture with open reduction and internal fixation.  5. Thoracic spine vertebroplasties x4.  6. January 21, 2007 right hip fracture with open reduction and      internal fixation.  7. January 25, 2007 right humerus fracture with open reduction and      internal fixation.   FAMILY HISTORY:  Significant for breast cancer in her mother and sister  and early coronary artery disease with a brother dying at age 39 of  coronary artery disease.  There is no family history of diabetes  mellitus or colon cancer.   SOCIAL HISTORY:  She is married and has two sons.  Her husband has  dementia and  is in the Memory Care Unit of the [facility].  She has no history of tobacco or alcohol abuse.  Her son, Jesusita Oka, who is a  Education officer, community, has her healthcare power attorney.  She has requested a do not  resuscitate order at the nursing home.   REVIEW OF SYSTEMS:  See history of present illness.   POINT OF CONTACT:  Dr. Erskin Burnet (son and healthcare power of attorney)  at telephone [phone], [phone], and [phone] (home).   PHYSICIANS PREVIOUSLY INVOLVED IN HER CARE:  1. Hal T. Pete Glatter, M.D., (outpatient primary care)  2. Lajuana Matte, MD, (hematology).  3. Barbaraann Share, MD,FCCP, (pulmonary).  4. Doralee Albino. Carola Frost, M.D., (orthopedics).  5. Dr. Vear Clock (pain management).   INITIAL PHYSICAL EXAM:  VITAL SIGNS:  Blood pressure systolic was 60-80  mmHg and diastolic was 36-62 mmHg, temperature 99.5, pulse initially was  162 beats per minute and decreased to 73 beats per minute after  adenosine was given, respiratory rate 33, pulse oxygen saturation 97% on  100% non-rebreather oxygen.  GENERAL APPEARANCE:  She is an elderly white female who was sleepy and  opened her eyes with exam.  HEENT:  Pupils were equal and round (3 mm diameter).  Extraocular  movements were intact.  NECK:  Supple, without jugular venous distention or carotid bruit.  CHEST: Clear to auscultation anteriorly.  HEART:  Regular rate and rhythm without significant murmur or gallop.  ABDOMEN:  Normal bowel sounds and no hepatosplenomegaly or tenderness.  EXTREMITIES:  The right upper extremity was initially in a splint which  was taken down and revealed moderate pitting edema of the right hand  with a intact posterior arm incision without purulence or erythema.  The  right hip incision was clean, dry, and intact with staples in place.  NEUROLOGIC:  She is sleepy and would open her eyes with exam but would  not speak or follow commands.   INITIAL LABORATORY STUDIES:  Serum sodium 134, potassium 3.9,  chloride  101, BUN 17, creatinine 0.9, glucose 118, serum lactate 26 (0.5 - 2.2).  INR 1.6.  Urinalysis had specific gravity 1.018 and was nitrite  negative.  White blood cell count 16.1, hemoglobin 10, hematocrit 32.8,  platelets 992.   A chest x-ray showed consolidation in the left lower lobe with a  probable small left-sided pleural effusion.   EKG showed narrow complex tachycardia with a heart rate approximately  155 and no signs of ischemia.   IMPRESSION AND PLAN:  1. Fever with hypotension:  This is likely secondary to sepsis  syndrome from pneumonia.  Her urinalysis argues against a urinary      tract infection.  There is no evidence of right upper extremity      cellulitis or right arm or hip infection.  I plan to add vancomycin      IV to Rocephin for broader staph aureus coverage.  We will obtain      blood cultures and urine cultures.  We will continue high-dose IV      fluids and dopamine to maintain her blood pressure systolic greater      than 80.  2. Paroxysmal supraventricular tachycardia:  She had a narrow complex      rhythm at a relatively high rate that was consistent with atrial      flutter with a 2:1 block versus atrioventricular nodal reentry      tachycardia.  Use of dopamine is problematic as it could exacerbate      this condition, however, her blood pressure currently warrants it.      I plan to taper dopamine to off as soon as possible.  We will      continue IV beta blocker treatment and obtain a cardiology      consultant opinion given that she broke through with her paroxysmal      supraventricular tachycardia on high dose beta blocker treatment.  3. Malnutrition:  This has been a chronic issue for her and is likely      due to her severe physical debilitation, although dysphagia could      be a possibility.  I plan to do obtain a speech pathologist      evaluation and consider the use of Panda versus a PEG tube if she      fails her swallowing  study.  Although she has a low left lower      extremity deep venous thrombosis, temporary use of Megace to      stimulate her appetite or Remeron will be considered.  4. Anticoagulation:  Currently she is slightly subtherapeutic by her      INR.  We will supplement with Lovenox and adjust her Coumadin per      pharmacist protocol.           ______________________________  Barry Dienes. Eloise Harman, M.D.     DGP/MEDQ  D:  02/10/2007  T:  02/11/2007  Job:  [id number]

## 2010-08-17 NOTE — Op Note (Signed)
NAME:  [name] [name], [name] [name]                 ACCOUNT NO.:  [id number]   MEDICAL RECORD NO.:  [id number]          PATIENT TYPE:  INP   LOCATION:  5020                         FACILITY:  [facility]   PHYSICIAN:  Robert A. Thurston Hole, M.D. DATE OF BIRTH:  Sep 04, 1923   DATE OF PROCEDURE:  01/21/2007  DATE OF DISCHARGE:                               OPERATIVE REPORT   PREOPERATIVE DIAGNOSES:  1. Right hip intertrochanteric fracture.  2. Right elbow olecranon and lateral condyle fracture.   POSTOPERATIVE DIAGNOSES:  1. Right hip intertrochanteric fracture.  2. Right elbow olecranon and lateral condyle fracture.   PROCEDURES:  1. Open reduction and internal fixation, compression hip screw, right      hip fracture using Synthes compression hip screw system with 80 mm      lag screw with 4-hole by 140 degree side plate with a separate      cannulated 7.3 mm x 70 mm screw.  2. Closed reduction, right elbow fracture.   SURGEON:  Elana Alm. Thurston Hole, M.D.   ASSISTANT:  Julien Girt, P.A.   ANESTHESIA:  General.   OPERATIVE TIME:  One hour.   ESTIMATED BLOOD LOSS:  150 mL.   COMPLICATIONS:  None.   DESCRIPTION OF PROCEDURE:  [name] is brought in the operating room on  January 21, 2007, placed under general anesthesia on her own bed and  then carefully transferred to the fracture table.  Her right hip was  placed through a reduction maneuver of gentle longitudinal traction,  flexion and then general extension and then placed in straight  longitudinal traction.  The left leg placed in suspension and carefully  padded.  AP and lateral x-ray of the right hip after this reduction  maneuver showed satisfactory position of the fracture.  The right hip  was prepped using sterile DuraPrep and draped using sterile technique.  She received vancomycin 1 gram IV preoperatively for prophylaxis.  Initially, through a 5 to 6 cm longitudinal incision based over the  lateral proximal femur, initial exposure was  made.  The underlying  subcutaneous tissues were incised along with a skin incision.  Iliotibial band was exposed and then incised longitudinally revealing  the underlying vastus lateralis fascia which was incised longitudinally  as well and then the lateral femur proximally was exposed.  Using  fluoroscopy and a 145 degree angle guide, a guide pin was drilled up  into the femoral neck and head in satisfactory position and then  measured.  Found to be 80 mm in length and then overdrilled with an 80  mm step drill and then an 80 mm lag screw was placed and then the 4-hole  by 140 degree side plate was placed and attached to the lag screw and  then attached to the femoral shaft with 4 separate 3.5 mm cortical  screws.  AP and lateral fluoroscopic x-rays confirmed satisfactory  position of the fracture and of the hardware.  Compression screw was  placed as well very gently.  A separate 7.3 mm cannulated screw was then  placed just superior to the compression to give more  rotational control  using fluoroscopic control as well and this was placed in satisfactory  position as well.   At this point, it was felt that excellent fixation and reduction was  achieved.  The wound was then irrigated and closed using 0 Vicryl to  close the vastus lateralis and iliotibial band.  Subcutaneous tissue was  closed with 0 and 2-0 Vicryl.  Skin closed with skin staples.  Sterile  dressings were applied.  After this was done and attention was turned to  the elbow, the posterior splint was removed.  AP and lateral  fluoroscopic x-rays showed a displaced olecranon fracture and a chronic  nonunion of the humeral shaft and a lateral epicondyle fracture.  A  general manipulation was carried out partially reducing the olecranon  fracture, although, this could not be completely reduced closed, it was  reduced by at least 50% as well as the lateral condyle fracture.  The  arm was then splinted in 60 degrees of  flexion, padded well.   At this point, it was felt all pathology had been satisfactorily  addressed.  The patient was then awakened, extubated and taken to the  recovery room in stable condition.  Needle and sponge count was correct  x2 at the end of the case.      Robert A. Thurston Hole, M.D.  Electronically Signed     RAW/MEDQ  D:  01/21/2007  T:  01/21/2007  Job:  [id number]

## 2010-08-17 NOTE — Discharge Summary (Signed)
NAME:  [name] [name], [name] [name]                 ACCOUNT NO.:  [id number]   MEDICAL RECORD NO.:  [id number]          PATIENT TYPE:  INP   LOCATION:  1429                         FACILITY:  [facility]   PHYSICIAN:  Michelene Gardener, MD    DATE OF BIRTH:  01-Mar-1924   DATE OF ADMISSION:  10/08/2006  DATE OF DISCHARGE:                               DISCHARGE SUMMARY   ADDENDUM to the Discharge Summary, which was initially dictated by Dr.  Berton Mount on December 22nd.   UPDATED DISCHARGE MEDICATIONS:  1. Cymbalta 60 mg p.o. once daily.  2. Ensure 237 ml p.o. three times daily.  3. Neurontin 800 mg p.o. twice daily.  4. Vicodin one to two tabs p.o. q.4h as needed.  5. Synthroid 25 mcg p.o. once daily.  6. Megace 400 mg p.o. twice daily.  7. Lopressor 75 mg p.o. twice daily.  8. Protonix 40 mg p.o. once daily.  9. Senokot one tab p.o. once daily.  10.Zocor 20 mg p.o. once daily.  11.Ativan 0.5 mg p.o. at bedtime.  12.Mirapex 0.5 mg p.o. at bedtime as needed.  13.Morphine sulfate, po bid   UPDATED COURSE OF HOSPITALIZATION:  This patient's course of  hospitalization until July 22nd was dictated by Dr. Berton Mount on  July 22nd.  Following that, patient has been stable.  A Hematology  consultation was done because of coagulopathy.  Ultrasound of the right  upper quadrant was recommended and it was done.  The results showed no  acute finding with a normal gallbladder and normal liver.  The patient  has also been followed by Palliative Care.  Her medications were  adjusted and her Neurontin was increased to 800 twice daily, and Vicodin  was added to her medications with good control of her pain.  Vicodin was  recommended to be given one to two tabs q.4h as needed.  Also,  Interventional Radiology was called again for a final decision regarding  her compression fracture, and they felt that there is no need for  surgical intervention because of her multiple comorbidities at this  time.   Orthopedic Interventional Radiology recommended a back brace to  be taken for the compression fracture in addition to the pain  medications.  The patient started on that and was followed by physical  therapy.  A repeat x-ray was done on her and showed a stable small  pleural effusion with no increase.  No more intervention was done  regarding the pleural effusion, and this is just to be followed by her  primary physician for repeat x-ray in around a month or so.  This  condition changed and developed more respiratory symptoms like  increasing shortness of breath or chest pain.   DISPOSITION:  Otherwise, her medical conditions remained stable since  prior hospital discharge summary dictation, and the rest of the  management was continued.   PHYSICIAN TIME:  40 minutes.      Michelene Gardener, MD  Electronically Signed     NAE/MEDQ  D:  10/29/2006  T:  10/29/2006  Job:  [id number]

## 2010-08-17 NOTE — Consult Note (Signed)
NAME:  [name] [name], [name] [name]                 ACCOUNT NO.:  [id number]   MEDICAL RECORD NO.:  [id number]          PATIENT TYPE:  INP   LOCATION:  5020                         FACILITY:  [facility]   PHYSICIAN:  Ramiro Harvest, MD    DATE OF BIRTH:  1924-02-04   DATE OF CONSULTATION:  01/19/2007  DATE OF DISCHARGE:                                 CONSULTATION   REASON FOR CONSULTATION:  Preoperative clearance and management of  chronic medical issues.   HISTORY OF PRESENT ILLNESS:  The patient is an 77 year old white female  with a history of DVT in September 2008, on Coumadin; history of  hypertension, osteoporosis, hypothyroidism, history of PSVT, recently  discharged from the hospital in July 2008 for a T12 compression fracture  and pneumonia.  The patient was then sent to rehab where she developed a  DVT and was placed on Coumadin.  Patient went back to [facility]  assisted  living where she fell the day prior to admission while reaching for a  walker and sustained a fracture to the right hip and right elbow.  Patient denied any syncope or chest pain nor any shortness of breath.  Patient was readmitted to the orthopedic surgeon for repair and we are  consulted for preoperative clearance and management of chronic medical  issues.   ALLERGIES:  PENICILLIN CAUSES A RASH AND A FENTANYL PATCH.   PAST MEDICAL HISTORY:  1. Left lower extremity DVT, September 2008.  2. T12 compression fracture.  3. Status post history of hemothorax.  4. Super therapeutic INR.  5. Paroxysmal SVT.  6. Thrombocytosis.  7. Chronic back pain.  8. Failure to thrive.  9. Multiple falls.  10.Hypothyroidism.  11.Gastroesophageal reflux disease.  12.History of CVA with aphagia.  13.Gastroesophageal reflux disease.  14.Asthma.  15.Osteoporosis.  16.History of upper GI bleed.  17.History of pneumonia.  18.Hypertension.  19.History of fibrocystic breast disease.  20.Chronic lower back pain.  21.Status post right shoulder  hemiarthropathy  x2.  22.Status post vertebroplasty.  23.Status post total abdominal hysterectomy.  24.Status post exploratory laparotomy.   MEDICATIONS:  1. Vicodin 1 to 2 tabs q. 4 to 6 hours p.r.n.  2. Metoprolol 75 mg b.i.d.  3. Calcium carbonate 600 mg t.i.d.  4. MiraLax 17 gm b.i.d..  5. Tylenol 325 mg as needed.  6. Xopenex 1.25 mg nebs t.i.d. p.r.n.  7. Coumadin 5 mg p.o. q. daily.  8. Phenergan 12.5 mg t.i.d.  9. Senokot 2 tabs daily.  10.Prilosec 20 mg q.a.m.  11.Levothyroxine 50 mcg q. daily.  12.Simvastatin 20 mg daily.  13.Ativan 0.5 mg q.h.s.  14.Morphine ER 30 mg q. 12 hours.  15.Gabapentin 800 mg b.i.d.  16.Cymbalta 60 mg daily.   SOCIAL HISTORY:  Patient lives at [facility], is married,  with her husband with Alzheimer's disease and has 2 sons.   FAMILY HISTORY:  Noncontributory.   REVIEW OF SYSTEMS:  The patient is aphasic, unable to obtain; per HPI.   PHYSICAL EXAMINATION:  VITAL SIGNS:  Temperature 98.4, pulse 114, blood  pressure 153/82, respiratory rate 20, saturating 93% on  room air.  GENERAL:  Patient is in moderate pain lying in bed.  RESPIRATORY:  Lungs are clear to auscultation bilaterally.  CARDIOVASCULAR:  Heart is tachycardic, regular rhythm.  No murmurs, rubs  or gallops.  HEENT:  Normocephalic, atraumatic.  Pupils are equal, round and reactive  to light.  Extraocular movements are intact.  Oropharynx is clear, no  lesions.  NECK:  Neck is supple.  No lymphadenopathy.  ABDOMEN:  Soft, nontender, nondistended.  Positive bowel sounds.  EXTREMITIES:  Right upper extremity in a splint.  Right lower extremity  externally rotated and wrapped.   LABORATORY DATA:  PT 27.7, INR 2.5.  Sodium 135, potassium 3.2, chloride  98, bicarbonate 26, BUN 6, creatinine 0.65, glucose 134, calcium 8.9.  Hemoglobin 9.9, white count 18.0, platelets 487, hematocrit 29.6.   Chest x-ray shows no acute cardiopulmonary disease, probable COPD.  Numerous  thoracolumbar kyphoplasty with cement,  right-sided pulmonary  artery tree.   Right elbow shows intra-articular fractures of the distal humerus and  proximal ulna, pathologic fracture of the mid right humerus.   Right hip:  Angulated, intertrochanteric fracture of the right femur.   ASSESSMENT AND PLAN:  1. Preoperative clearance.  Patient with no significant history of      coronary artery disease, no diabetes, normal creatinine, no recent      history of CVA, no recent chest pain or shortness of breath, no      history of CHF.  Patient does have a remote history of a CVA for      which she is aphasic.  Patient, due to her several comorbidities,      will make her a mild to moderate risk for cardiac complication for      surgery.  We will go ahead and clear patient for surgery, continue      her on her home dose of beta blocker to decrease the risk of      cardiac complications.  Will put patient back on therapeutic dose      of Lovenox secondary to a DVT.  Will hold the Lovenox the night      prior to surgery and patient will continue on other medications.  2. DVT.  Will hold Coumadin for now, try to reverse the INR, start her      on Lovenox therapeutic dose and hold the night prior to surgery.  3. GERD.  Continue Protonix.  4. Hypothyroidism.  Continue levothyroxine.  Will check a TSH.  5. Asthma, stable.  Continue Xopenex nebs p.r.n.  6. Hypertension.  Continue metoprolol.  7. Leukocytosis, likely secondary to stress of hip fracture and right      elbow fracture.  Patient is afebrile.  Will monitor and follow for      now.  No need for any antibiotics at this current point in time.  8. Prophylaxis:  Protonix for GI and SCDs for DVT.   It has been a pleasure taking care of this patient.      Ramiro Harvest, MD  Electronically Signed     DT/MEDQ  D:  01/19/2007  T:  01/21/2007  Job:  [id number]

## 2010-08-17 NOTE — Discharge Summary (Signed)
NAME:  [name] [name], [name] [name]                 ACCOUNT NO.:  [id number]   MEDICAL RECORD NO.:  [id number]          PATIENT TYPE:  INP   LOCATION:  5004                         FACILITY:  [facility]   PHYSICIAN:  Charlcie Cradle. Delford Field, MD, FCCPDATE OF BIRTH:  02-11-1924   DATE OF ADMISSION:  02/26/2007  DATE OF DISCHARGE:  02/28/2007                               DISCHARGE SUMMARY   DISCHARGE DIAGNOSES:  1. Acute respiratory distress secondary to bilateral hospital health      care acquired pneumonia (no organism specified).  2. Chronic renal insufficiency/acute renal insufficiency.  3. Supraventricular tachycardia.  4. History of deep vein thrombosis.   LABORATORY DATA:  February 27, 2007:  Sodium 139, potassium 4.4,  chloride 109, CO2 16, glucose 93, BUN 22, creatinine 1.27.  February 27, 2007:  White blood cell count 22.4, hemoglobin 11, hematocrit 33.1,  platelet count 459,000.  February 26, 2007:  Arterial blood gas - pH  7.41, pCO2 20, pO2 186, bicarbonate 12.9, saturations 100%.  February 26, 2007:  PT/INR 28.3/2.6.   MICROBIOLOGY:  None currently.   CHEST X-RAY:  Obtained on February 26, 2007, demonstrates bilateral  lower lobe airspace disease.   PROCEDURES:  A new left upper extremity PICC line was placed over guide  wire with tip in SVC/RA junction, total diameter of 40 cm.  This is a  double lumen PICC line placed by Interventional Radiology.   BRIEF HISTORY:  An 77 year old female recently treated for pneumonia and  found to be febrile with a temperature of 102 with hypotension during  routine PICC placement the afternoon of the 24th.  She also reported  shortness of breath.  She had recent admission for sepsis on November  13th.  Over the two days preceding hospital admission she complained of  increasing weakness, and increased shortness of breath.  Over the last  summer she has been slowly deteriorating with failure to thrive in the  outpatient setting at the nursing home.   HOSPITAL COURSE BY DISCHARGE DIAGNOSES:  1. Bilateral health care associated pneumonia with associated      respiratory distress.  Ms. Bergman was admitted to the inpatient      setting by the Pulmonary Critical Care Service.  She had requested      a full DNR status, and Do Not Intubate status as well as refusing      BiPAP.  Therefore, she was admitted to the regular ward, treatment      has included supplemental oxygen, bronchodilators, and empiric      antibiotics.  She has continued to improve to the point where she      can be returned to the nursing home.  Again, she declines      aggressive therapy, she wishes for only palliation, and comfort      care as indicated.  She will complete a 7-day course of empiric      antibiotics.  2. Chronic renal insufficiency.  This was treated with IV fluids.      Serum creatinine on day of hospital admit noted to be 1.46, on  hospital day #2 creatinine trending down to 1.27.  Plan for this is      to continue IV fluids for now, and receive normal p.o. intake.  3. Supraventricular tachycardia.  Therapy for this was IV fluid      resuscitation, as well as treatment of underlying problem.  4. History of DVT.  Patient has been on Coumadin for this.  5. Geriatric failure to thrive.  Ms. Brailsford has been chronically ill      for some time.  She has been followed by Dr. Jarome Matin in the      outpatient setting at the nursing home who actually had seen Ms.      Koppel here in the hospital and is aware of the events.  He has      agreed to resume the primary care of Ms. Guyette once she returns to      the nursing home.  She will return to the nursing home DNR, DNI      with again palliative care focus.   DISCHARGE MEDICATIONS:  1. Fortaz 2 grams IV every 24 hours x7 days.  2. Vancomycin 1 gram every 48 hours x7 days.  3. Protonix 40 mg p.o. daily.  4. Metoprolol 100 mg p.o. b.i.d.  5. Cymbalta 30 mg p.o. b.i.d.  6. Megace 400 mg p.o. b.i.d.  7.  Coumadin 5 mg daily and dosed for PT/INR.  8. Remeron 30 mg at bedtime p.r.n.  9. Synthroid 50 mcg p.o. daily.  10.Aldactone 100 mg p.o. b.i.d.  11.Vicodin 5/325 p.r.n.  12.MiraLax p.r.n.  13.Iron sulfate daily.  14.Zocor 20 mg p.o. daily.  15.Ativan 0.5 mg at bedtime p.r.n.  16.Vitamin D daily.  17.Neurontin 300 mg p.o. daily.  18.Dulcolax p.r.n.   Medication regimen to be evaluated by Dr. Eloise Harman at skilled nursing  facility and continued at his discretion.   DISCHARGE DIET:  As tolerated.      Zenia Resides, NP      Charlcie Cradle. Delford Field, MD, [facility]  Electronically Signed    PB/MEDQ  D:  02/27/2007  T:  02/27/2007  Job:  [id number]   cc:   Barry Dienes. Eloise Harman, M.D.

## 2010-08-17 NOTE — Consult Note (Signed)
NAME:  [name] [name]                 ACCOUNT NO.:  [id number]   MEDICAL RECORD NO.:  [id number]          PATIENT TYPE:  INP   LOCATION:  1429                         [facility]ACILITY[facility]  [facility]   PHYSICIAN:  [name] L. Ladona Ridgel, MD  DATE OF BIRTH:  1923-05-04   DATE OF CONSULTATION:  10/24/2006  DATE OF DISCHARGE:                                 CONSULTATION   TIME SPENT ON CONSULT:  1-1/2 hours.   REASON FOR CONSULTATION:  Symptom management along with end of life and  goals of care.   REQUESTING PHYSICIAN:  [name] Chapel, MD.   GOALS:  1. Vision of the goal is return to [facility] with adequate pain      control.  2. DNR already on chart.   PROBLEMS:  1. Back pain.  2. Abnormal coagulation studies.  3. Dysphagia.  4. Appetite.  5. History of gastrointestinal bleed.  6. History of hemothorax status post tap.  7. Multiple falls.  8. History of cerebrovascular accident.  9. History of tachycardia, now resolved.   PLAN:  1. Back pain:  This is a chronic condition with acute exacerbation due      to a T12 fracture.  She does have a noted history of kyphosis with      multiple interventions previously.  Her pain is under slightly      better control today.  She is restarting her p.o. medications today      including Cymbalta, Neurontin, Vicodin along with IV morphine. For      her neuropathic pain radiating around her back, Cymbalta and      Neurontin are reasonable choices.  She will be restarting her p.o.      narcotic medications today with Vicodin.  We may be able to      decrease her IV morphine and transition to p.o. long-acting agent      along with short-term p.o. breakthrough medications.  Tylenol is      reasonable medication for this patient, and this should be      continued if she is ever transitioned off Vicodin.  We will hold      other medicinal interventions such as diphosphate or calcitonin.      It would be ideal to change at what time it is possible.  I  would      avoid NSAIDs because of recent GI bleed and hemothorax along with      coagulation issues.  I have discussed the case with [employer].  He      will discuss with the primary MD regarding bracing.  Given the      patient's kyphosis and location of the fracture, a typical hard-      shell brace may be less useful and less comfortable for the      patient.  It is also noted that [facility] did not consider the patient a      candidate for intervention.  2. Anticoagulation:  Per primary team.  3. Dysphagia:  Continue taking medications per primary team.  4. Gastrointestinal bleed and hemothorax status post  tap:  Per primary      team who are following her.  5. History of cerebrovascular accident with multiple falls:  We would      like to arrange for a full goal-of-care meeting with available      family.  The patient and Dr. Ladona [name] to plan for discharge and      continues the arrangements.  This has been requested for afternoon      of July 23 with the family.  6. DNR.   IMMEDIATE ISSUES:  The patient is reporting 8/10 pain which is the worst  pain she has had since this admission began.  The patient was given 2 mg  morphine IV with significant decrease in her pain on recheck. This was  done to bridge her until her p.o. medications can take effect.   HISTORY OF PRESENT ILLNESS:  The patient is a pleasant 77 year old  female with a complicated past medical history who presented with  dizziness and a fall.  She had coffee-ground emesis and a hemoglobin of  9.7 along with a chest x-ray showing a right lung airspace opacity and  effusion.  She was placed on antibiotics for presumed pneumonia, and  pulmonary was consulted.  It was felt that the effusion was most likely  hemothorax.  This was tapped yielding approximately 1 liter of bloody  fluid.  Also during this hospitalization, abnormal coagulation studies  were noted.  The case was discussed with hematology, and it was thought  that  the patient's condition was due to a clotting factor deficiency.  In addition to all of these other issues, the patient's chronic back  pain, for which she has had multiple vertebroplasties, has increased.  Palliative care was consulted for pain management.  A T12 fracture was  noted on CT.  She has not been out of bed since this hospitalization  started per her report.  The patient's most bothersome back pain appears  to originate in the lower thoracic area and radiate around to her  abdominal wall.   Other issues:  The patient has also had workup for dysphagia and is  presently on a thickened diet.  Discussion has been made regarding her  code status, and she was a DNR before palliative care was consulted.   PAST MEDICAL HISTORY:  As above.  In addition, the patient has:  1. Severe osteoporosis.  2. Hypothyroidism.  3. History of paroxysmal atrial tachycardia.  4. Fibrocystic breast disease.  5. Hypertension.  6. Multiple vertebroplasties.  7. Bilateral mastectomy.  8. TAH-BSO.  9. Right shoulder surgery x3.   SOCIAL HISTORY:  The patient lives at [facility].  Her husband also  lives there in the memory unit.  She has two sons; both have frequent  contact with her.  She does not smoke or drink.   FAMILY HISTORY:  Noncontributory.   ALLERGIES:  PENICILLIN.   MEDICATIONS:  Include Cymbalta, Ensure, Neurontin, Vicodin, Synthroid,  Ativan, Megace, Toprol, Flagyl, Protonix, vitamin K, Mirapex,  Simvastatin, loperamide, Reglan, and morphine.   REVIEW OF SYSTEMS:  As stated above, the patient has had no fevers, no  chills, no recent vomiting.  Her last bowel movement was yesterday.   PHYSICAL EXAMINATION:  VITAL SIGNS:  Temperature 97.8, pulse 102,  respiratory rate 18, systolic blood pressure 151, diastolic blood  pressure 75, O2 saturation 99% on 2 liters.  GENERAL:  The patient is alert and oriented, in discomfort lying in the  bed.  HEENT:  Brow is furrowed but otherwise  normocephalic and atraumatic.  Mucous membranes moist.  NECK:  Supple.  CARDIOVASCULAR:  Heart rate in the 90-100s.  No murmur appreciated.  CHEST: Chest wall is not tender to palpation anteriorly.  PULMONARY:  Clear to auscultation bilaterally; however, the patient does  have slight retractions.  She does have pain on taking a deep breath.  ABDOMEN:  Soft, nontender, nondistended.  Positive bowel sounds.  BACK:  Diffusely tender to palpation, also painful with movement in the  bed.  EXTREMITIES:  The patient has good strength in her feet bilaterally.  Her sensation is intact in her feet.  She also has 2+ dorsalis pedis  pulses with warm extremities.  Her hand have symmetric grip; however,  her range of motion is significantly decreased in the right upper arm  and shoulder.  This is a chronic condition and unchanged for her.   RECENT LABORATORY DATA:  INR 1.3.  CBC: White count 12.1, hemoglobin  9.4, platelets 869.  Last creatinine was on July 21 and was 0.61.   ASSESSMENT AND PLAN:  See above.  I appreciate the consult.  We will  follow along for medication adjustment.   Palliative Medicine Attending Addendum:  I have seen and examined this  patient with the Fellow.  Agree with his assessment and plan.  We will  adjust medications as we reevaluate.      Dwana Curd Para March, M.D.      [name] L. Ladona Ridgel, MD  Electronically Signed    GSD/MEDQ  D:  10/24/2006  T:  10/25/2006  Job:  [id number]

## 2010-08-17 NOTE — Consult Note (Signed)
NAME:  [name] [name], [name] [name]                 ACCOUNT NO.:  [id number]   MEDICAL RECORD NO.:  [id number]          PATIENT TYPE:  INP   LOCATION:  1224                         FACILITY:  [facility]   PHYSICIAN:  John C. Madilyn Fireman, M.D.    DATE OF BIRTH:  Apr 23, 1923   DATE OF CONSULTATION:  10/08/2006  DATE OF DISCHARGE:                                 CONSULTATION   REASON FOR CONSULTATION:  Coffee ground emesis.   HISTORY OF ILLNESS:  The patient is an 77 year old white female resident  of [facility] who had a fall on Wednesday and another fall last night  and was found this morning in the floor by caretakers and had vomited  some coffee ground appearing emesis and then did the same later here in  the emergency room.  She had stable vital signs on presentation but  apparently flipped into a supraventricular tachycardia that was treated  medically and resolved.  She had otherwise stable vital signs with blood  pressure of approximately 160/80 and had heme negative stools.  Her  hemoglobin was 9.7 and her last obtainable baseline hemoglobin was 12.8  in April, 2007.  She has severe osteoporosis but as far as I can tell  does not take a nonsteroidal anti-inflammatory drug.  She has had  colonoscopy in the past by Dr. Laural Benes or Sherin Quarry and apparently was  due this year, but due to her age Dr. Pete Glatter felt that she could  cease colon cancer screening.  She may have had an endoscopy in [facility] about 5 years ago but has no known history of internal bleeding  or peptic ulcer disease.  She denies any abdominal pain, has not noticed  any change in stools and states her last stool was yesterday.  Rectal  exam by Dr. Donnetta Hutching showed heme negative stool on this admission.   PAST MEDICAL HISTORY:  1. Severe osteoporosis.  2. Hypothyroidism.  3. History of PAT.  4. Fibrocystic breast disease.  5. Hypertension.   SURGERIES:  1. Multiple vertebroplasties.  2. Bilateral mastectomy.  3.  TAH/BSO.  4. Right shoulder surgery x3.   MEDICATIONS:  Neurontin, Vicodin, calcium, Lasix, Mirapex, Remeron,  Synthroid, Vicodin.   ALLERGIES:  PENICILLIN.   FAMILY HISTORY:  Noncontributory.   PHYSICAL EXAM:  A well-developed, well-nourished elderly white female  somewhat slow in her verbal responses, in no acute distress.  Blood  pressure 164/87, pulse 118.  HEART:  Regular rate and rhythm without murmur.  LUNGS:  Crackles at the right base.  ABDOMEN:  Soft, nondistended with normoactive bowel sounds.  No  hepatosplenomegaly, masses or guarding.   LABS:  Hemoglobin 9.7, hematocrit 28.7, BUN 12, creatinine 0.66.  A WBC  15,900.  Chest x-ray suggests right lower lobe pneumonia.   IMPRESSION:  1. Hematemesis and fall with anemia, suggestive of some degree of GI      bleeding, hemodynamically stable.  2. Pneumonia with leukocytosis and infiltrate on chest x-ray.   PLAN:  1. Will treat empirically with Protonix and defer endoscopy until      acute management of  her pneumonia, which is currently to be treated      with clindamycin.  2. Will monitor any further emesis and keep on clear liquid diet.  3. Will probably plan endoscopy in 2-3 days.           ______________________________  Everardo All Madilyn Fireman, M.D.     JCH/MEDQ  D:  10/08/2006  T:  10/08/2006  Job:  [id number]   cc:   Hal T. Stoneking, M.D.  Fax: [phone]

## 2010-08-17 NOTE — H&P (Signed)
NAME:  [name] [name], [name] [name]                 ACCOUNT NO.:  [id number]   MEDICAL RECORD NO.:  [id number]          PATIENT TYPE:  INP   LOCATION:  0104                         FACILITY:  [facility]   PHYSICIAN:  Kela Millin, M.D.DATE OF BIRTH:  04-20-1923   DATE OF ADMISSION:  10/08/2006  DATE OF DISCHARGE:                              HISTORY & PHYSICAL   PRIMARY CARE PHYSICIAN:  Dr. Merlene Laughter.   CHIEF COMPLAINT:  Dizziness, fall.   HISTORY OF PRESENT ILLNESS:  The patient is an 77 year old white female  with past medical history significant for hypertension, chronic pain  syndrome, history of CVA, hypothyroidism, GERD, history of asthma,  osteoporosis who presents with the above complaints.  Her family states  that they found her on the floor today and she states that she was  feeling dizzy some time between last night and the morning of admission  and fell and was unable to get up from the floor.  She reports that  after falling she began vomiting and her family states that they saw  dried up coffee ground vomitus around her when they found her today.  She admits to a cough productive of yellowish phlegm for a few days.  She denies fevers, dysuria, diarrhea, melena, hematochezia.  She also  denies chest pain, shortness of breath, focal weakness and no blurry  vision.   The patient was seen in the ER and had another episode of coffee ground  emesis.  Her hemoglobin was found to be 9.7, her chest x-ray showed new  right lung airspace opacities and the right effusion, her sodium was  noted to be low at 131 with a potassium of 3.3, the patient was noted to  be tachycardic, she is admitted to the [facility] for  evaluation and management.   PAST MEDICAL HISTORY:  As stated above.   MEDICATIONS:  Only the names of her medications are listed but no doses,  and patient does not remember them but I have obtained some dosages from  her last discharge summary:  1. Aspirin 81  mg.  2. Hydrocodone/acetaminophen q.i.d.  3. Lorazepam 1 mg.  4. Metoprolol.  5. Mirapex 0.5 mg p.o. q.h.s.  6. Neurontin 600 mg p.o. b.i.d.  7. Synthroid 25 mcg daily.  8. Zocor.   ALLERGIES:  PENICILLIN.   SOCIAL HISTORY:  Lives at [facility], denies tobacco, denies alcohol.  Her son is a Education officer, community with [facility].   REVIEW OF SYSTEMS:  As per HPI, other review of systems negative.   PHYSICAL EXAM:  GENERAL:  The patient is a pale appearing elderly white  female alert and appropriate, in no respiratory distress.  VITAL SIGNS:  Her temperature is 97.9, blood pressure 147/79, her pulse  is 111, max noted in ER 171, O2 sat is 98%.  HEENT:  Conjunctival pallor, PERRL, EOMI, dry mucous membranes, no oral  exudates.  NECK:  Supple, no adenopathy and no thyromegaly.  LUNGS:  She has rhonchi, moderate air movement, no wheezes.  CARDIOVASCULAR:  Tachycardic, regular, normal S1, S2.  No S3  appreciated.  ABDOMEN:  Soft, bowel sounds present, nontender, nondistended, no  organomegaly and no masses palpable.  EXTREMITIES:  No cyanosis and no edema.  NEURO:  She is alert and oriented x3, cranial nerves II-XII intact,  nonfocal exam.   LABORATORY DATA:  Chest x-ray as above.  Patient also had lumbar and  pelvic films done which showed no acute findings, no fractures.  Her  white cell count is 15.9 with a hemoglobin of 9.7, hematocrit 28.7,  platelet count of 392,000.  Sodium is 131 with a potassium of 3.3,  chloride 97, CO2 21, glucose 144, BUN is 12, creatinine is 0.66.  Her  albumin is 3.4, AST is 31 and her point of care markers are negative x2.   ASSESSMENT AND PLAN:  1. Hematemesis - likely upper GI bleed.  Will type and screen, monitor      H&H, hydrate/transfuse as appropriate.  Will place patient on      proton pump inhibitor, consult GI, patient prefers to see Dr.      Sherin Quarry or his partner.  2. Anemia - secondary to number 1, as discussed above.  Follow.  3.  Right lower lobe pneumonia - likely aspiration.  As discussed      above, will start on Avelox/clindamycin.  As noted, PATIENT IS      PENICILLIN ALLERGIC.  Obtain cultures and follow.  4. Dizziness, status post fall - likely secondary to above.  Will      obtain serial cardiac enzymes, a 2D echo as appropriate, hydrate,      treat infection and follow.  5. Hypokalemia.  Replace potassium.  6. Gastroesophageal reflux disease.  Proton pump inhibitor as above.  7. History of hypertension.  Monitor blood pressures and treat with IV      metoprolol as appropriate while patient nothing by mouth.  8. History of asthma.  Bronchodilators p.r.n. and follow.  9. Hypothyroidism.  Follow and resume Synthroid.  10.History of cerebrovascular accident.      Kela Millin, M.D.  Electronically Signed     ACV/MEDQ  D:  10/08/2006  T:  10/08/2006  Job:  [id number]   cc:   Hal T. Stoneking, M.D.  Fax: [phone]

## 2010-08-17 NOTE — Discharge Summary (Signed)
NAME:  [name] [name], [name] [name]                 ACCOUNT NO.:  [id number]   MEDICAL RECORD NO.:  [id number]          PATIENT TYPE:  INP   LOCATION:  1429                         FACILITY:  [facility]   PHYSICIAN:  Barnetta Chapel, MDDATE OF BIRTH:  Jun 29, 1923   DATE OF ADMISSION:  10/08/2006  DATE OF DISCHARGE:  10/24/2006                         DISCHARGE SUMMARY - REFERRING   PRELIMINARY DISCHARGE SUMMARY:   PRIMARY CARE Jalal Rauch:  Hal T. Stoneking, M.D.   PRELIMINARY DISCHARGE DIAGNOSES:  1. Bloody pleural effusion, most likely secondary to hemothorax.  2. Diarrhea, resolved.  3. Leukocytosis.  4. Elevated INR.  5. Likely upper gastrointestinal bleed, resolved.  6. Paroxysmal supraventricular tachycardia, resolved.  7. Thrombocytosis.  8. Chronic back pain.  9. T12 vertebral body compression fracture.  10.Status post fall.  11.General decline in the patient's health.   CONSULTATIONS:  Done so far includes  1. Pulmonary consult.  Pulmonary consult was called for pleural      effusion which was mainly right-sided.  The pulmonologist advised a      thoracentesis.  The patient had the first thoracentesis done on      October 13, 2006, and 1.1 liters of bloody effusion was removed.  The      patient had a second thoracentesis on October 17, 2006, and 780 mL of      bloody pleural effusion was removed.  The last CT scan of the chest      was done on October 21, 2006, and the impression was that of resolving      right-sided hemothorax with decreased pleural fluid and dependent      hemorrhage, slight loculation felt to be secondary; improved      appearance of the lungs with resolving right-sided atelectasis,      moderate to large hiatal hernia, numerous compression fractures and      vertebroplasty changes in the thoracic spine, minimally increased      displacement of T12 fracture, methyl methacrylate embolism also      documented.  2. Interventional radiology consult was called for T12  compression      fracture.  It was agreed that the patient should not be put through      another kyphoplasty or vertebroplasty.  3. Hematology consult called.  The patient was not seen by the      hematologist.  However, the patient's case was discussed over the      phone with the hematologist, Dr. Arbutus Ped.  Hematology consult was      called for elevated INR.  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  4. Palliative care consult called.  The patient palliative care team      will follow the patient up and help with pain management and end of      life issues.   DISCHARGE MEDICATIONS:  To be dictated by the discharging physicians.   IMAGING STUDIES:  1. The patient had a CT scan of the chest which revealed significant      pleural effusion,  much more on the right side.  2. Repeated chest x-rays.  3. CT of the abdomen and pelvis.  The CT of the pelvis did not show      any acute pelvic findings.  The CT of the abdomen revealed a      transverse fracture through the T12 vertebral body, multilevel      kyphoplasties, bilateral pleural effusion, large hiatal hernia,      atherosclerosis and __________  abdominal __________ .  4. CT scan of the chest done on October 12, 2006, revealed new large      nonloculated right pleural effusion, a component of which may be a      hemothorax, complete right lower lobe collapse, no pneumothorax,      stable bi-apical pleural-parenchymal scarring with some nodularity      in the right upper lobe, chronic embolized bone cement in the right      lung from prior vertebroplasties, small left effusion and left      lower lobe atelectasis, large hiatal hernia, severe kyphosis with      numerous thoracic compression fractures of uncertain acuity with      several prior vertebroplasties.   PERTINENT LABORATORY DATA:  Persistent leukocytosis. Elevated INR.  Correction of PT and PTT with mixing studies.  C.  difficile toxins were  negative.   BRIEF HISTORY AND HOSPITAL COURSE:  Please refer to the history and  physical done on October 08, 2006.  The patient is an 77 year old female  with past medical history significant for hypertension, chronic back  pain syndrome, history of CVA, hypothyroidism, GERD, asthma, and  osteoporosis.  Apparently, the patient was found down on the floor prior  to admission.  The patient was said to have felt nauseous and vomited  coffee-ground materials.  There was associated cough which was  productive of yellowish phlegm but no fever.  Chest x-ray on admission  revealed opacification of the right lower lobe.   The patient was admitted as a case of likely upper GI bleed with  associated anemia, possible right lower lobe pneumonia and hypokalemia.  Apparently, on admission, the patient's potassium was 3.3.  The patient  was initially started on IV antibiotics.  During the hospital course,  the patient had a CT scan of the chest (October 12, 2006).  Please see  above for the findings.  Pulmonology consult was called based on  significant right-sided pleural effusion.  The pulmonologist advised  thoracentesis and felt that the effusion was most likely secondary to  hemothorax.  Thoracentesis was done on October 13, 2006, and October 17, 2006.  Please see above for details.  The patient developed diarrhea with  persistent leukocytosis and thrombocytosis.  C. difficile work-up has  been negative.  The patient's diarrhea has resolved.  Leukocytosis has  improved significantly and white blood cell count is down to 12.1 today.  However, the thrombocytosis persists with platelet count being over  900,000.  The CT chest done on October 21, 2006, is essentially as above.  The plan is to obtain another CT of the chest in 1-2 days' time to rule  out any possible loculation, i.e., if the family agrees to that.  However, the patient's general condition has continued to decline and  the  family's wish and tendency is for the patient to be made  comfortable.  Elevated INR and D-dimer was noted during the hospital  course.  Hematology consult was called to rule out possible DIC versus  factor deficiency.  The case was discussed with the hematologist over  the phone and Dr. Arbutus Ped felt that it as more likely factor deficiency.  Mixing studies was advised.  The patient continued to have back pain  during her stay in the hospital.  She had imaging studies of the spine.  A new T12 compression fracture was noted.  Interventional radiology  consult was called to assess for possible kyphoplasty.  It was agreed  between the radiologist and the patient's family that it would be best  to manage the patient conservatively for now.  Brace has been advised  while mobilizing the patient as the fracture is unstable.  Palliative  care team has also been called to help with pain management and end of  life issues.   The patient's condition, prognosis and further care were extensively  discussed with the patient's family.   Likely disposition will be skilled nursing facility versus Hospice care.  Final disposition will be dictated by the discharging physician.   DIET:  The patient remains at risk for aspiration.  Speech evaluation  was consulted.  For now, they have recommended nectar-thick diet.   DISCHARGE PLANS AND FOLLOW-UP:  To be dictated by the discharging  physician.      Barnetta Chapel, MD  Electronically Signed     SIO/MEDQ  D:  10/24/2006  T:  10/24/2006  Job:  [id number]   cc:   Hal T. Stoneking, M.D.  Fax: [phone]

## 2010-08-17 NOTE — Discharge Summary (Signed)
NAME:  [name] [name], [name] [name]                 ACCOUNT NO.:  [id number]   MEDICAL RECORD NO.:  [id number]          PATIENT TYPE:  INP   LOCATION:  5020                         FACILITY:  [facility]   PHYSICIAN:  Robert A. Thurston Hole, M.D. DATE OF BIRTH:  1923-07-02   DATE OF ADMISSION:  01/19/2007  DATE OF DISCHARGE:  01/29/2007                               DISCHARGE SUMMARY   ADMITTING DIAGNOSES:  1. Right hip intertrochanteric fracture.  2. Right elbow olecranon fracture with lateral condyle fracture.  3. Deep venous thrombosis.  4. Hypertension.  5. Chronic low back pain.  6. Aphasia.  7. History of a stroke.  8. Hypothyroidism.  9. Gastroesophageal reflux.  10.History of supraventricular tachycardia.  11.Asthma.  12.Osteoporosis.  13.History of fibrocystic breast disease.  14.History of right lower lobe pneumonia.  15.History of a left lower extremity deep venous thrombosis in      September 2008.  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  17.History of multiple vertebroplasties.  18.History of a total abdominal hysterectomy.  19.History of breast implants.   HOSPITAL COURSE:  This 77 year old white female fell on January 19, 2007, while at [facility].  She was transported by EMS,  since she was unable to walk, complaining of right hip pain and right  elbow pain.  She was initially evaluated at [facility] and due  to her multiple medical problems subsequently transferred to [facility]  for medical management and surgical management.  On hospital day #1 her  hemoglobin was 8.5.  Her white cell count was 15.2.  Her INR was 2.5.  Her sodium was 127.  [facility] hospitalists were consulted due to her  electrolyte abnormalities and multiple medical issues.   She was transfused 2 units of packed red blood cells due to her upcoming  surgeries.  Her Coumadin was placed on hold.  She was  bridged with full-  dose mg per kilogram Lovenox while her Coumadin was on hold.  She was  placed on Protonix for peptic ulcer prophylaxis as well as treatment of  her gastroesophageal reflux.  Her hypertension was stable.  On hospital  day #2 she underwent open reduction, internal fixation of right hip  intertrochanteric fracture as well was closed reduction of right elbow  fracture.  She had excellent stabilization of her right hip.  However,  her right elbow fractured acceptable reduction was unable to be achieved  by a closed reduction; therefore, orthopedic trauma specialist, Dr.  Myrene Galas was consulted for open-management of this.  Dr. Carola Frost  agreed to see her and operate on her; however, her son, who is power of  attorney, requested that Dr. Marisue Ivan be her upper extremity surgeon,  as he has operated on her multiple times for her nonunion.  Multiple  attempts were made to contact Dr. Hyman Hopes, and he did not feel that an  interhospital transfer was medically necessary to treat this upper  extremity fracture, so hospital transfer was not made.  After long discussion with the patient's power of attorney Dr. Carola Frost did  operate on her right upper extremity on January 25, 2007.  While in the  hospital her hyponatremia was treated with normal saline, and did  rectify itself.  She received 4 units of packed red blood cells for  postop blood loss anemia, acute-on-chronic anemia.  Her lower extremity  DVT was treated on Lovenox.  She is now on both mg per kilogram Lovenox  as well as Coumadin.   Her INR today continues to be subtherapeutic, but is rising.  A skin  care consult was ordered on January 23, 2007 due to her significant  amount of time in bed.  She was complaining of some right heel pain.  Her heels were floated, no skin breakdown was discovered; however, they  recommended her heels be floated, her TED hose be removed for 30 minutes  out of every shift, and recommend  bed-to-chair transfers using a Hoyer  lift as well as reposition q.2 h.  On October 23 the patient was  medically stable and underwent open reduction internal fixation of her  right elbow by Dr. Carola Frost.  She tolerated that procedure well, and  postoperatively she is nonweightbearing on her right upper extremity,  and her right lower extremity.  A Rehab consult was ordered in the  hospital.  However, due to her limited weightbearing status; she was not  a candidate for an acute rehab admission.  Therefore, we are planning on  sending her to [facility] for skilled nursing.  She will be nonweightbearing  on her right upper extremity for 4-6 weeks.  She will be  nonweightbearing on her lower extremity for 6-8 weeks.  Yesterday, she  had a chest x-ray that did show some left lower lobe small atelectasis  versus early consolidation.  With these findings we will add Avelox 400  mg daily for 10 days to her discharge medications.   LABS AT DISCHARGE:  Hemoglobin is stable at 10.4.  White cell count is  increased at 12.5.  Platelets are increased at 662.  Sodium is normal at  135, potassium is normal at 4.2.  Renal function is normal with a BUN of  8 and a creatinine of 0.43.  She has been afebrile.  She does have some  left arm edema.  No redness, no warmth.  We feel that this is purely  dependent in nature.   Her right hip surgical wound is well-approximated.  She has a small  amount of serous drainage from this, and no redness, and no warmth,  minimal swelling.  Her right arm splint is being changed; today, by Dr.  Myrene Galas before discharge.  She is complaining of some burning pain  under her splint at this time; however, a new splint will be applied  before she is discharged to skilled nursing.   DISCHARGE INSTRUCTIONS:  She will need to be bed-to-chair transfers  daily, using a Hoyer lift.  She will need to be up in a chair daily due  to her left lower lobe atelectasis.  She will need to be  turned q.2 h.  due to her significant amount of time spent in bed to prevent pressure  sores.  She will need physical therapy, nonweightbearing on her right  upper and lower extremity for transfers.  She will need a repeat chest x-  ray at the facility to follow her left lower lobe atelectasis closely.  She will be on a regular diet.  DISCHARGE MEDICATIONS:  1. Avelox 400 mg p.o. daily. The Avelox was started on October 26 so      she can discontinue on November 5.  2. Lovenox is 125 mg subcu q.12 h. until her INR is 2.0 or higher.  It      was 1.7 today.  Coumadin 6 mg p.o. adjust accordingly to maintain      INR between 2.0 in 3.0.  3. Senokot 2 tablets p.o. nightly.  4. Synthroid 50 mcg daily.  5. MiraLax 17.5 grams in 8 ounces of water p.o. b.i.d.  6. Zocor 20 mg p.o. daily.  7. Protonix 40 mg p.o. daily.  8. MS Contin 30 mg p.o. b.i.d.  9. Nuiron 150 mg daily.  10.Ativan 0.5 mg p.o. nightly.  11.Cymbalta 60 mg p.o. daily.  12.Vitamin D 50,000 units every seven days, next dose due today,      January 29, 2007.  13.Neurontin 800 mg p.o. b.i.d.  14.Metoprolol 100 mg p.o. b.i.d.  15.Colace 100 mg p.o. b.i.d.  16.Ensure p.o. q.i.d.  She has been receiving strawberry in the      hospital.  17.Neurodex 0.5 mg p.o. nightly p.r.n. restless leg, last taken      January 26, 2007.  18.Vicodin 5/500 one to two q.4 h. p.r.n. pain, last dose January 28, 2007.  19.Xopenex 1.25 mg p.o. t.i.d., last dose January 26, 2007.  20.Phenergan 12.5 mg p.o. t.i.d.   She is being discharged to skilled nursing in stable condition.  We will  see her back in the office in 7 days for x-ray and staple removal.  Our  office number is [phone].  She needs to follow up with Dr. Myrene Galas for her right upper extremity.  She needs to follow up with [facility] for [place] [facility] for her regular medical problems.  Dr.  Magdalene Patricia telephone number in his office is [phone], and she needs to   followup with a regular medical doctor for her multiple medical  problems.      Kirstin Shepperson, P.A.      Robert A. Thurston Hole, M.D.  Electronically Signed    KS/MEDQ  D:  01/29/2007  T:  01/29/2007  Job:  [id number]

## 2010-08-20 NOTE — Op Note (Signed)
[place]. [facility]  Patient:    [name] [name], [name] [name]                        MRN: [id number] Proc. Date: 10/01/99 Adm. Date:  [id number] Attending:  Colbert Ewing                           Operative Report  PREOPERATIVE DIAGNOSIS:  Displaced multipart, impacted, comminuted, closed right proximal humerus fracture.  POSTOPERATIVE DIAGNOSIS:  Displaced multipart, impacted, comminuted, closed right proximal humerus fracture with unicortical fracture humeral shaft distally and also impaction of fracture anteroinferior glenoid.  PROCEDURE:  Right shoulder examination under anesthesia.  Open intervention with cemented Osteonics hemiarthroplasty right shoulder.  Size 12 mm with a 45 x 15 mm unipolar head.  Repair of capsule and rotator cuff structures with bone fragments to prosthesis.  Prosthesis cemented.  Closed treatment of unicortical shaft fracture.  SURGEON:  Loreta Ave, M.D.  ASSISTANT:  Arlys John D. Petrarca, P.A.-C.  ANESTHESIA:  General.  BLOOD LOSS:  150 cc.  BLOOD GIVEN:  None.  SPECIMENS:  None.  CULTURES:  None.  COMPLICATIONS:  None.  DRESSING:  Soft compressive with shoulder immobilizer.  PROCEDURE:  Patient brought to the operating room, placed on the operating table in supine position.  After adequate anesthesia had been obtained, the entire fracture pattern examined with fluoroscopy.  There was no recognized fracture of the shaft on initial x-rays or with fluoroscopy or examination. Placed in a beach chair position, prepped and draped in usual sterile fashion. Deltopectoral incision created.  Hemostasis obtained with cautery. Deltopectoral interval opened.  Conjoin tendon identified, retracted.  The fracture immediately evident.  Fracture fragments were identified.  The shoulder was entered by utilizing the lesser tuberosity anterior fragments left attached to the subscapularis tendon retracted medially.  The  greater tuberosity and posterior fragments attached to the supra and infraspinatus were tagged and retracted superiorly and posteriorly.  Long head of the biceps still intact.  Loose fragments within the joint all debrided.  Impact injury to the anteroinferior aspect of the glenoid smoothed.  Capsular ligamentous structures otherwise intact to the glenoid.  The shaft was exposed.  Extremely osteoporotic, brittle bone.  With hand reaming, the canal was opened up to 12 mm.  I then utilized a trial with a 12 mm replacement with a bipolar head, 4 mm neck and 45 mm head.  The bipolar head initially chosen as I was trying to go with a noncemented approach.  I had countersunk the humeral component, did a trial reduction, which was initially extremely acceptable and with the bipolar head was able to restore the overall height and well match the glenoid in terms of size with the bipolar component.  The definitive component was assembled, was hammered down in place in the humerus.  However, when this was seated and the shoulder reduced, and then examined, she developed rotational instability of the component within the humerus.  I removed the component.  We then thoroughly inspected the wound, including looking at this fluoroscopically.  I elected to go with a cemented approach.  A bone peg was created and placed down the canal distal to the tip of the prosthesis.  Cement was prepared, placed down in the canal under pressure and the component firmly seated in place.  This gave me excellent rotational stability.  However, when we looked at the distal tip of the  prosthesis with fluoroscopy, it was evidence that there was cement extruded out from the canal at that level and slightly distal to the peg.  This was in the soft tissue laterally just distal to the deltoid attachment to the humerus.  On circumferential viewing of the shaft, it was evident a unicortical fracture within the humerus at that  level that allowed extrusion of cement.  There was no event during surgery to account for that; however, her bone was extremely osteoporotic, but I still suspect that it was a unicortical break that was present at the time of her trauma.  Still had longitudinal stability and we had cemented the prosthesis. This was left in place and no attempt was to extend our incision down to provide further internal fixation, as I elected to treat that part of the injury closed.  We did however, when we cemented the humeral component, brought this up to its normal anatomic position.  Therefore, the bipolar head and neck were removed and a unipolar 15 x 45 mm component was placed on top of the prosthesis.  The shoulder was then reduced.  We had restored height, stability and anatomy proximally.  The fracture fragments with the attached rotator cuff were repaired through the holes on the prosthesis proximally to allow for this and firmly tied down.  The humeral component had been placed at 30 degrees of retroversion to ensure stability.  The fragments were also then oversewed with nonabsorbable #2 into the soft tissue past the level of the fragments to maintain a good contour.  The subscapularis and supraspinatus interval was also closed with nonabsorbable sutures so that the entire shoulder capsule had a restored watertight closure.  She had excellent stability and anatomic matching of the shoulder and I could bring the shoulder through full passive motion.  Wound was copiously irrigated.  Deltopectoral interval allowed to close.  Skin and subcutaneous tissue closed with Vicryl and Steri-Strips.  Margins of the wound injected with Marcaine.  The entire construct looked at again fluoroscopically once completed.  Unicortical crack was recognized and we are going to treat this later with a fracture brace. For now placed in a shoulder immobilizer with sling.  Anesthesia reversed, brought to recovery room.   Tolerated surgery well, no complications. DD:  10/01/99 TD:  10/02/99 Job: [id number]

## 2010-08-20 NOTE — Consult Note (Signed)
[place]. [facility]  Patient:    [name] [name], [name] [name] Visit Number: [id number] MRN: [id number]          Service Type: MED Location: [id number] Attending Physician:  Ginette Otto Dictated by:   Genene Churn. Love, M.D. Proc. Date: 02/06/01 Admit Date:  02/06/2001                            Consultation Report  PATIENT ADDRESS: [place], [place] [address]  DATE OF BIRTH:  07-06-1923  REASON FOR ADMISSION:  This is one of multiple [facility] admissions for this 77 year old right-handed, white, married female from [place], [place], admitted from the emergency room for evaluation of speech difficulties.  HISTORY OF PRESENT ILLNESS:  Mrs. Losier has a three-year history of hypertension and a long history of osteoporosis status post vertebroplasty x 3 over the past one and one-half years and poor union of a right humeral fracture secondary to a fall.  She has had a history of migraine with visual equivalent, asthma, thrombocytosis, and hypothyroidism.  She has never had any known history of stroke or cardiac arrhythmias or diabetes mellitus.  She does not smoke cigarettes.  The day prior to admission at about 6 oclock p.m. she noted the onset of word finding difficulties, without associated headache, syncope, seizure, or chest pain, which continued and even waxed and waned some during the next 24 hours.  She came to the emergency room and was seen by Dr. Pete Glatter and admitted.  There has been also some dysarthria and some dysphagia as well.  MEDICATIONS: 1. Aspirin 81 mg q.d. 2. Premarin 0.625 mg q.d. 3. Norvasc 5 mg q.d. 4. Synthroid 0.025 mg q.d. 5. Norco 7.5/325 b.i.d. 6. Prednisone 5 mg q.d. for a rash.  PHYSICAL EXAMINATION:  GENERAL:  Well-developed white female with a cast on right upper arm.  VITAL SIGNS:  Blood pressure on left arm 150/80, heart rate 84 and regular. She was afebrile.  NECK:  Supple.  There were  no bruits.  NEUROLOGIC:  Mental Status: She was alert and oriented x 3.  She followed one, two, and three-step commands but had word finding difficulties.  She could repeat phrases.  She could read.  She could calculate.  There was no evidence of any left-right confusion.  She could recognize her fingers.  Her cranial nerve examination revealed visual fields full, disks flat.  The extraocular movements were full.  Mild decrease in right corner of her mouth versus the left.  Tongue midline, uvula midline, gags present.  Moved all extremities well.  Motor examination showed good strength to upper and lower extremities with right upper arm not well tests.  She had two-point discrimination, graphesthesia of her hands with pinprick intact.  Deep tendon reflexes were 2+, and plantar responses were downgoing.  LABORATORY DATA:  CT scan showed evidence of an old left basal ganglia ischemic stroke.  Hemoglobin 11.5, white blood cell count 12,400.  PT was 12.8 with and INR of 1.8.  Sodium 134, potassium 3.9, CO2 content 23, total bilirubin 0.4.  IMPRESSION: 1. Aphasia (Code 784.3). 2. Suspect stroke (Code 434.01). 3. Thrombocytosis (Code 289.9). 4. Hypothyroidism (Code 244.9). 5. Hypertension (Code 796.2). 6. Osteoporosis.  PLAN:  At this time, place patient on subcutaneous heparin, aspirin, Pepcid, and have a Doppler of the carotids, a 2-D echocardiogram, and use telemetry.  Thank you. Dictated by:   Genene Churn. Love,  M.D. Attending Physician:  Ginette Otto DD:  02/06/01 TD:  02/08/01 Job: [id number]

## 2010-08-20 NOTE — H&P (Signed)
NAME:  [name] [name], [name] [name]                 ACCOUNT NO.:  [id number]   MEDICAL RECORD NO.:  [id number]           PATIENT TYPE:   LOCATION:                               FACILITY:  [facility]   PHYSICIAN:  Thora Lance, M.D.       DATE OF BIRTH:   DATE OF ADMISSION:  10/05/2005  DATE OF DISCHARGE:                                HISTORY & PHYSICAL   CHIEF COMPLAINT:  Cough, fever.   HISTORY OF PRESENT ILLNESS:  This is an 77 year old white female resident of  [facility] in the independent living section who presents with 48 hours  of fever as high as 103.  She has also had a cough productive of green-  colored phlegm and generalized weakness and decreased ability to walk and  finish her sentences.  She is mildly confused.  In the ER she was found to  have possible bilateral pneumonia.   PAST MEDICAL HISTORY:  1.  Hypertension, currently on no antihypertensives other than Lasix.  2.  Severe osteoporosis with multiple compression fractures treated with      multiple vertebroplasties or kyphoplasties.  3.  Restless legs syndrome.  4.  Hypothyroidism.  5.  History of paroxysmal atrial tachycardia.  6.  Hiatal hernia without GER.  7.  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  8.  Asthma.  9.  Fibrocystic disease of the breasts.  10. Chronic back pain, Dr. Thyra Breed.  11. Knee DJD.   PAST SURGICAL HISTORY:  1.  Multiple vertebroplasties.  2.  Bilateral mastectomy for severe fibrocystic disease.  3.  TAH/BSO.  4.  Right shoulder surgery x3.  5.  Small bowel obstructive surgery.   ALLERGIES:  PENICILLIN.   CURRENT MEDICATIONS:  1.  Calcium carbonate 600 mg b.i.d.  2.  Lasix 40 mg daily.  3.  Neurontin 400 mg b.i.d.  4.  Mirapex 0.5 mg q.h.s.  5.  Aspirin 81 mg a day.  6.  Lorazepam 1 mg b.i.d.  7.  Remeron 15 mg q.h.s.  8.  Synthroid 0.025 mg daily.  9.  Vicodin 10/325 one p.o. q.6 p.r.n. pain.   FAMILY HISTORY:  Noncontributory.   SOCIAL HISTORY:  She is married.  She  lives at [facility]  independent living.  She has two supportive sons.  One son is a Education officer, community at  [employer].  She does not smoke or drink alcohol.   REVIEW OF SYSTEMS:  Per HPI.   PHYSICAL EXAMINATION:  GENERAL:  Weak-appearing elderly female.  VITAL SIGNS:  Temperature 100.5, spiking to 102.3, blood pressure 153/61,  heart rate 111, respirations 24, oxygen saturation 93-95% on room air.  HEENT:  Pupils equal, regular, respond to light.  Extraocular movements are  intact.  Anicteric.  Ears:  TMs clear.  Oropharynx:  Moist mucous membranes,  clear.  NECK:  Supple.  No lymphadenopathy.  LUNGS:  Crackles at the bases.  HEART:  Regular rate and rhythm without murmur, gallop, or rub.  ABDOMEN:  Soft, nontender.  Normal bowel sounds.  No mass or hepatomegaly.  EXTREMITIES:  No edema.  NEUROLOGIC:  Cranial nerves II-XII are intact.  Strength is 5/5 in  extremities.  Gait was not tested.  The patient has some mild slowness of  speech but no definite aphasia.   LABORATORY DATA:  WBC 14.9, hemoglobin 12.8, platelet count 381.  Chemistries:  Sodium 135, potassium 4, chloride 100, bicarbonate 25, BUN 23,  creatinine 0.9, glucose 104.  Liver function tests are normal.  Urinalysis  was negative.   CT scan of the head shows chronic ischemic changes and old strokes and no  acute changes.  Chest x-ray:  Bronchitic changes, left perihilar, and a  questionable right low basilar infiltrate.   ASSESSMENT:  1.  Probable community-acquired pneumonia.  2.  Mild volume depletion.   PLAN:  1.  Admit.  2.  Avelox IV.  3.  IV fluids.  4.  Hold Lasix.  5.  Blood cultures x2.  6.  Physical therapy consultation.           ______________________________  Thora Lance, M.D.    JJG/MEDQ  D:  10/05/2005  T:  10/05/2005  Job:  [id number]   cc:   Hal T. Stoneking, M.D.  Fax: [phone]

## 2010-08-20 NOTE — Discharge Summary (Signed)
[place]. [facility]  Patient:    [name] [name], [name] [name] Visit Number: [id number] MRN: [id number]          Service Type: Attending:  Hal T. Stoneking, M.D. Dictated by:   Hal T. Pete Glatter, M.D.   CC:         Deanna Artis. Sharene Skeans, M.D.                           Discharge Summary  ADDENDUM:  The previous discharge summary was dictated on February 08, 2001. Originally we had decided that Mrs. Adkison would be a good candidate for [facility].; However, she continued to do so well that [employer]. Hickling, M.D., and the social worker felt that she would be able to return home after her stroke. She was felt to be fairly stable with ambulation, but would continue to require physical therapy and speech therapy at home.  Therefore, she will be discharged home with home health follow-up.  DISCHARGE DIAGNOSIS:  Left lateral putamen/ventral lateral thalmus stroke with some persistent visual problems and speech problems.  MEDICATIONS AT DISCHARGE: 1. Norco 7.5/325 mg twice a day. 2. Norvasc 5 mg a day. 3. Synthroid 0.025 mg a day. 4. Premarin 0.625 mg a day. 5. Ecotrin 325 mg a day. 6. Ativan 1 mg twice a day as needed.  FOLLOW-UP:  She will be seen back in the office for neurological referral in approximately one month and will be seen back in my office in six weeks. Dictated by:   Hal T. Stoneking, M.D. Attending:  Hal T. Stoneking, M.D. DD:  02/12/01 TD:  02/12/01 Job: [id number]

## 2010-08-20 NOTE — Consult Note (Signed)
NAME:  [name], [name]                 ACCOUNT NO.:  [id number]   MEDICAL RECORD NO.:  [id number]          PATIENT TYPE:  INP   LOCATION:  0160                         FACILITY:  [facility]   PHYSICIAN:  [name] [name], M.D.   DATE OF BIRTH:  1923-12-07   DATE OF CONSULTATION:  05/18/2005  DATE OF DISCHARGE:                                   CONSULTATION   CONCLUSION:  1.  Paroxysmal supraventricular arrhythmia, atrial fibrillation versus      multifocal atrial tachycardia since admission and asymptomatic to the      patient.  It is highly possible that the patient has had asymptomatic      episodes prior to admission as an outpatient.  2.  Left lower lobe pneumonia.  3.  Multiple prior cerebrovascular events, possibly embolic, possibly other.   RECOMMENDATIONS:  1.  Consider long-term Coumadin therapy if no contraindications.  The person      with the best knowledge of the patient is the primary care physician,      Dr. Merlene [name].  2.  If atrial fibrillation is present, since the patient is asymptomatic      during the tachycardia rate control rather than rhythm control, it would      seem appropriate.  3.  The prior echocardiogram done within the past year demonstrated normal      LV function and probably does not need to be repeated.  4.  Beta blocker therapy would seem appropriate and would start this therapy      orally when the patient is able to resume p.o. intake.   COMMENTS:  The patient is 77 years of age and was admitted to the hospital  with a diarrheal (noroviral) illness.  I have previously read an  echocardiogram on this patient but never seen her clinically.   After being admitted with nausea and vomiting, the patient had a decreased  level of consciousness and developed paroxysms of supraventricular  tachycardia with an irregularly irregular QRS relationship suggesting atrial  fibrillation or possibly even multifocal atrial tachycardia.  IV Cardizem  did not  significantly control the patient's rhythm.  According to the  family, there is no prior history of heart disease.  The patient is  currently in a sinus rhythm with first degree AV block and is in no acute  distress.   ADMISSION MEDICATIONS:  Calcium, Neurontin, Mirapex, hydrochlorothiazide,  Remeron, Synthroid, Norvasc, Lasix, and Vicodin.   PAST MEDICAL HISTORY:  Her preexisting medical illnesses include:  1.  Hypertension.  2.  Osteoporosis.  3.  Restless leg syndrome.  4.  Hypothyroidism.  5.  Paroxysmal atrial tachycardia.  6.  Thrombocytosis.  7.  Hiatal hernia with gastroesophageal reflux disease.  8.  History of left putamenal infarction five years ago with an expressive      aphasia as a result.  9.  History of depression.  10. Echocardiogram from April of 2006 demonstrating normal LV function and      minimal mitral regurgitation.   PHYSICAL EXAMINATION:  GENERAL:  At the time of the evaluation, in the  afternoon of May 18, 2005, the patient is in no acute distress.  She is  lying flat.  VITAL SIGNS:  Her blood pressure is 122/60, heart rate is 80 with first  degree AV block and no sinus rhythm noted on monitor.  SKIN:  Clear.  HEENT:  Unremarkable.  NECK:  No obvious JVD.  LUNGS:  Clear.  CARDIAC:  No rub, gallop, click or murmur.  ABDOMEN:  Soft.  Bowel sound are normal.  EXTREMITIES:  No edema.  NEUROLOGIC:  Exam does reveal an expressive aphasia.   All recorded EKGs in the chart were reviewed, a total of 6 EKGs.  The  initial EKG, on admission, revealed left atrial abnormality, normal sinus  rhythm with PACs.  The second EKG which captured the patient's  tachyarrhythmia revealed either atrial fibrillation or multifocal atrial  tachycardia as did the third EKG.  The 4th, 5th and 6th EKG revealed sinus  tachycardia with first degree AV block.   DISCUSSION:  After reviewing the patient's clinical data, including her  neurological history, I am of the  opinion that long-term anticoagulation  would be appropriate.  I am not absolutely certain that atrial fibrillation  has been present although, given the possibility of embolic CVAs, we need to  assume this.  At best, the patient has had multifocal atrial tachycardia  while hospitalized and, at worst, these episodes were atrial fibrillation.  Multifocal atrial tachycardia is a rhythm akin to atrial fibrillation and  certainly the patient will be predisposed to atrial fibrillation given this  type of rhythm abnormality.  MET, however, in this situation may be related  to the patient's pneumonia and may actually resolve or be easily controlled  with beta blocker therapy once the pulmonary process is controlled.  Overall, I feel it would be wise to chronically anticoagulate this patient  given her neuro status but this needs to be discussed with the patient's  primary physician, who will follow her chronically.  My concern would be  that, with her altered mental status, Coumadin therapy could be considered  relatively contraindicated.      [name] [name], M.D.  Electronically Signed     HWS/MEDQ  D:  05/19/2005  T:  05/23/2005  Job:  [id number]   cc:   [name] [name], M.D.  Fax: [phone]

## 2010-08-20 NOTE — H&P (Signed)
[place]  Patient:    [name], [name] Visit Number: [id number] MRN: [id number]          Service Type: MED Location: 3000 [id number] Attending Physician:  Ginette Otto Dictated by:   Hal T. Stoneking, M.D. Admit Date:  02/06/2001                           History and Physical  IDENTIFYING DATA:  [name] [name] is a delightful, 77 year old, white female who has actually been in fairly good health, but she has had a very difficult time with a right arm fracture which she has had an orthopedic procedure on.  She has had chronic pain in the right shoulder.  She is currently being evaluated at [facility] to possibly have this redone with a different appliance being used.  She states that for the last week she has noted a little bit of blurred vision off and on primarily when she would look at the television set.  Then last evening her husband noted that her speech did not seem to be as clear. She apparently was having some word-finding problems.  He offered to take her to the emergency room at that time, but she declined.  She was apparently better this morning.  Then at about 12:30 or 1 p.m. this afternoon, she had a very severe problem with an inability to swallow and difficulty with speech. Her son, who is a Education officer, community, brought her to the emergency room.  While waiting for a room, the speech started to improve.  He did notice during that time that the right side of her face seemed to droop slightly.  She currently states that her swallowing is better, but she is still having some word-finding problems.  She has not had any history of headache or recent head trauma.  ALLERGIES:  Allergic to PENICILLIN, which has caused a rash.  She also developed a local reaction to [facility].  CURRENT MEDICATIONS: 1. ______ 7.5/325 mg b.i.d. 2. Norvasc 5 mg a day. 3. Synthroid 0.025 mg a day. 4. Premarin 0.625 mg a day. 5. Premarin 5 mg a day recently  started for her rash. 6. OxyContin 10 mg.  She did have a dose of this last night, but has only    taken it occasionally. 7. She does take an aspirin every day.  PAST MEDICAL HISTORY:  Remarkable for:  1. Restless leg syndrome.  2. Hypothyroidism.  3. Hypertension diagnosed in 1999.  4. History of allergic rhinitis and asthma followed by E. Verdis Prime., M.D.  5. History of osteoporosis with T8, T10, and T12 compression fractures.  She     had a vertebroplasty in December of 2001.  6. History of PAT.  7. History of fibrocystic breast disease.  8. History of diverticulosis.  9. Past history of thrombocytosis with platelet counts averaging 522-549.      Seen by Dolan Amen, M.D. 10. History of hiatal hernia. 11. History of iron deficiency anemia with a negative EGD and colonoscopy in     March of 2002.  PAST SURGICAL HISTORY:  She has had a subcutaneous mastectomy with breast implants in 1977.  These were redone a few years later.  She had a hysterectomy and bilateral salpingo-oophorectomy in 1992 by Malachi Pro. Ambrose Mantle, M.D.  She had a fractured leg in 1996, a fractured arm in 1954, a fractured ankle in 1981, and  right shoulder surgery in the past.  FAMILY HISTORY:  Her father died at age 67 of prostate cancer.  Her mother had breast cancer at the age of 27.  A brother had heart disease at the age of 42. A sister had heart disease at age 28.  A sister, age 41, has breast cancer. One brother at the age of 65 died of unclear cause.  Two sons in good health.  SOCIAL HISTORY:  [name] is married.  She lives with her husband.  She has two grown sons.  One son is a Education officer, community here in [place], [place].  She does not consume alcohol.  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  She also worked part-time in an Scientist, forensic.  She does not smoke.  REVIEW OF SYSTEMS:  Currently no headache.  She states that her vision is still slightly blurry.  No  trouble with her hearing.  Denies any shortness of breath.  No chest pain.  Denies any abdominal pain.  No GU or GYN complaints.  PHYSICAL EXAMINATION:  Blood pressure 146/61, respiratory rate 18, temperature 97.3 degrees, pulse 90.  SKIN:  Warm and dry.  HEENT:  Pupils equal, round, and reactive to light.  Disks are sharp.  LUNGS:  Clear.  NECK:  No carotid bruits heard.  HEART:  Regular rate and rhythm without murmurs, rubs, or gallops.  ABDOMEN:  Soft.  No hepatosplenomegaly or masses palpated.  NEUROLOGIC:  She is alert and oriented to the month, although she thought it was February 08, 2001, rather than February 06, 2001.  She knew the year was 2002.  She knew she was in [facility].  She did have evidence of some word-finding problems during conversation.  Cranial nerves II-XII are intact.  Motor exam with 5/5 strength.  Deep tendon reflexes 2+ and symmetrical.  LABORATORY DATA:  CT scan reveals question of a 1 cm subacute versus old left basal ganglion infarct.  Pro time 12.8, INR 1.0.  Hemoglobin 11.5, white count 12,400, platelet count 522.  Sodium 134, potassium 3.9, creatinine 0.9, total bilirubin 0.4. LFTs are normal.  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 Question if this is the acute issue or if she has some anterior circulation problems with the speech being involved.  PLAN:  Will admit.  Continue her aspirin.  Will order an MRI and MRA.  Will check her swallowing with speech therapy.  Will obtain a neurology consultation. Dictated by:   Hal T. Stoneking, M.D. Attending Physician:  Ginette Otto DD:  02/06/01 TD:  02/07/01 Job: [id number]
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
## 2010-08-20 NOTE — Procedures (Signed)
[place]. [facility]  Patient:    [name] [name], [name] [name]                          MRN: [id number] Proc. Date: 06/06/00 Attending:  Petra Kuba, M.D. CC:         Hal T. Stoneking, M.D.  Dolan Amen, M.D.   Procedure Report  PROCEDURE PERFORMED:  Esophagogastroduodenoscopy with biopsies.  ENDOSCOPIST:  Petra Kuba, M.D.  INDICATIONS FOR PROCEDURE:  Patient with iron deficiency anemia with nondiagnostic colonoscopy.  Consent was signed after risks, benefits, methods, and options were thoroughly discussed with both [name] [name] and [name] [name] husband prior to any premeds given.  MEDICATIONS USED:  Addition medicines for this procedure since it followed the colonoscopy were fentanyl 20 mcg, Versed 3 mg.  DESCRIPTION OF PROCEDURE:  The video endoscope was inserted by direct vision. The esophagus was normal.  In the distal esophagus was a moderately large hiatal hernia.  The scope passed into the stomach and advanced through a normal pylorus into a normal duodenal bulb and around the C-loop to a normal second portion of the duodenum, possibly we advanced to a third portion of the duodenum and scattered distal biopsies were obtained to rule out any malabsorption state.  The scope was withdrawn back to the bulb and a good look there ruled out ulcers in that location.  Scope was withdrawn back to the stomach and retroflexed.  The angularis, fundus, lesser and greater curve were normal except for some significant atrophic gastritis.  On retroflexion high in the cardia a moderately large hiatal hernia was confirmed.  The scope was straightened.  Straight visualization of the stomach confirmed the atrophic gastritis, ruled out any other lesions.  A few scattered biopsies of the stomach were obtained to confirm the above.  The scope was then slowly withdrawn back to 20 cm.  No additional esophageal findings were seen.  The scope was reinserted into the stomach.  Air was suctioned  and the scope removed.  The patient tolerated the procedure well.  There was no obvious immediate complication.  ENDOSCOPIC DIAGNOSIS: 1. Moderate hiatal hernia. 2. Atrophic gastritis, status post biopsy. 3. Otherwise normal esophagogastroduodenoscopy with duodenal biopsies to rule    out malabsorption.  PLAN:  Happy to see back p.r.n.  Will await pathology and touch base with [name] [name]. Otherwise return care to Hal T. Pete Glatter, M.D. and Dolan Amen, M.D. to recheck guaiacs, follow CBCs, adjust [name] [name] iron, consider small bowel follow-through or CAT scan.  If this is due to [name] [name] hiatal hernia which we have seen before, we might want to try pump inhibitors or H2 blockers longterm as well as longterm iron to prevent a recurrence and as above, happy to see back sooner p.r.n. DD:  06/06/00 TD:  06/07/00 Job: [id number]

## 2010-08-20 NOTE — Discharge Summary (Signed)
NAME:  [name] [name], [name] [name]                 ACCOUNT NO.:  [id number]   MEDICAL RECORD NO.:  [id number]          PATIENT TYPE:  INP   LOCATION:  1609                         FACILITY:  [facility]   PHYSICIAN:  Sherin Quarry, MD      DATE OF BIRTH:  1923/12/13   DATE OF ADMISSION:  05/12/2005  DATE OF DISCHARGE:  05/26/2005                                 DISCHARGE SUMMARY   [name] [name] is a 77 year old lady who has been a resident of the [facility].  She initially presented on May 12, 2005, with acute onset of  nausea, vomiting, and diarrhea.  She was weak and had difficulty standing or  walking.  She was having difficulty taking p.o. fluids.  For this reason,  because of her advanced age, it was felt prudent to admit her to the  hospital for further evaluation.   PHYSICAL EXAMINATION:  Described by Candyce Churn, M.D.  VITAL SIGNS:  Blood pressure 159/93, temperature 101, pulse 104.  HEENT:  Within normal limits.  CHEST:  Clear.  CARDIOVASCULAR:  Normal S1 and S2 without murmurs, rubs, or gallops.  ABDOMEN:  Benign.  There were normal bowel sounds without masses or  tenderness.  NEUROLOGY:  The patient was oriented x3.  She was conversant, but lethargic.   LABORATORY DATA:  May 12, 2005.  White count 15,900, normal liver  functions.  Sodium 142, potassium 4.1, chloride 112, CO2 18, creatinine 0.9,  BUN 31.  Glucose 129, amylase and lipase were normal.   Chest x-ray showed a possible left lower lobe infiltrate.  KUB of the  abdomen was unremarkable.  On admission, Dr. Kevan Ny placed the patient on an  IV of D5 and half normal saline with 10 mEq of Kay Ciel at 150 mL per hour  as well as clear liquid diet.  She was given Phenergan p.r.n. for nausea.  On February 9, because of concern about possible left lower lobe pneumonia,  the patient was started on Cefepime in a dose of 2 grams IV every 24 hours.  A PICC line was placed to facilitate intravenous medications.  On February  11, the patient was noted to have evidence of paroxysmal atrial  fibrillation.  For this reason, she was placed on a Cardizem infusion at 10  mg per hour which was subsequently decreased to 5 mg per hour.  Intravenous  heparin was instituted.  On February 13, the patient was noted to have  decreased level of consciousness as well as labored breathing.   On physical examination, she was noted to have weakness of her left upper  extremity and concern was raised about a possible stroke.  Consultation was  obtained from [facility]. Hickling, M.D. of the neurology service who felt  that the patient had experienced a subacute left frontal stroke and that  this might have been embolic secondary to a cardiac etiology.  He felt the  patient was not a candidate for Coumadin and that he would initially place  her on IV heparin and then on aspirin or Aggrenox.  Consultation was  obtained  from Lyn Records, M.D. who agreed that the patient had  experienced paroxysmal atrial fibrillation.  He indicated that he would  consider the use of longterm Coumadin therapy.  He reviewed previous  echocardiograms which showed normal LV function and he indicated that the  patient would probably best be managed with a beta blocker.  An MRI scan of  the brain was performed and this study did not confirm the presence of a  stroke.  It showed only atrophy and chronic ischemic changes.  Thus it  appeared on the basis of the MRI that the previously described left frontal  parietal white matter lesion was not an acute stroke.  A carotid study was  done which showed a 40% left ICA stenosis and no ICA stenosis on the right.  Speech therapy evaluation suggested that the patient could tolerate a  regular diet and did not need to be on any special preparations.  It was Dr.  Darl Householder ultimate recommendation that the patient receive aspirin 325 mg  daily. He felt that Coumadin was not currently indicated.  The patient's  oral  intake remained relatively poor.  She ultimately received a total of 12  days of antibiotics for the presumed pneumonia.  The course which was  initiated with Cefepime was completed with Primaxin at a dose of 250 mg IV  every 8 hours.  During the course of her hospitalization, the patient  remained weak with decreased energy and decreased exercise tolerance.  It  was felt that she was too weak to return to an independent living situation  and arrangements were made with the [facility] for her to be cared for in  their skilled nursing facility at least on a temporary basis.   DISCHARGE DIAGNOSES:  1.  Nausea, vomiting, and diarrhea possibly secondary to [place] agent.  2.  Chronic back pain.  3.  Restless leg syndrome.  4.  Pneumonia possibly secondary to aspiration.  5.  Hypothyroidism.  6.  Depression.  7.  Osteoporosis.  8.  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  9.  Possible transient atrial fibrillation.   DISCHARGE MEDICATIONS:  1.  Mirapex 4 mg at bedtime.  2.  Remeron 15 mg at bedtime.  3.  Metoprolol 25 mg b.i.d.  4.  Synthroid 25 mcg daily.  5.  Protonix 40 mg b.i.d.  6.  Lasix 20 mg daily.  7.  Aspirin 325 mg daily.  8.  Ensure p.r.n.  9.  Tylenol 325 mg one or two every 6 hours p.r.n.  10. Phenergan 12.5 mg p.o. q.6 hours p.r.n. for nausea.   FOLLOW UP:  She will presumably follow up with Dr. Pete Glatter.           ______________________________  Sherin Quarry, MD     SY/MEDQ  D:  05/26/2005  T:  05/26/2005  Job:  [id number]   cc:   Hal T. Stoneking, M.D.  Fax: [phone]   Deanna Artis. Sharene Skeans, M.D.  Fax: [phone]   Lyn Records, M.D.  Fax: [phone]

## 2010-08-20 NOTE — Procedures (Signed)
EEG NUMBER:  [id number] or [id number]; it is inaccurate.   Ordered by Dr. Sharene Skeans.   This is a 77 year old with a dehydration, possible left frontal stroke with  history of old left brain stroke. This is a portable 17-channel EEG 1  channel devoted to EKG utilizing the International 10/20 lead placement  system. The patient was described clinically as being awake and asleep,  although electrographically she appeared to be awake throughout. There was  no appearance of normal sleep architecture. While awake, the background  consisted of a fairly well-developed, somewhat poorly modulated and poorly  organized 8 to 10 Hz alpha activity which is predominant in the posterior  head regions, not able to really distinguish reactivity patterns. No clear  interhemispheric asymmetry is identified in terms of the background, but  there were some sharp waves coming from the left frontotemporal region.  Small sharp waves were seen occasionally; occasionally, they were higher  amplitude sharp waves. No clinical activity was noted associated with this,  and they were not fully entrained, clear-cut epileptic discharges.  Activation procedures were not performed. The EKG monitor reveals a  relatively regular rhythm with a somewhat tachycardiac rate at 102 beats per  minute.   CONCLUSION:  Abnormal EEG demonstrating some small sharp wave activity from  the left frontal temporal region. This may indicate a lowered seizure  threshold and could be compatible with a underlying structural disorder such  as stroke which is compatible with the patient's clinical history. No  definitive seizure activity was seen, however. Clinical correlation is  recommended.      Catherine A. Orlin Hilding, M.D.  Electronically Signed     ZOX:WRUE  D:  05/18/2005 11:13:13  T:  05/18/2005 13:54:50  Job #:  [id number]   cc:   Deanna Artis. Sharene Skeans, M.D.  Fax: [phone]

## 2010-08-20 NOTE — H&P (Signed)
NAME:  [name] [name], [name] [name]                 ACCOUNT NO.:  [id number]   MEDICAL RECORD NO.:  [id number]          PATIENT TYPE:  INP   LOCATION:  0104                         FACILITY:  [facility]   PHYSICIAN:  Candyce Churn, M.D.DATE OF BIRTH:  12/31/1923   DATE OF ADMISSION:  05/12/2005  DATE OF DISCHARGE:                                HISTORY & PHYSICAL   CHIEF COMPLAINT:  Nausea, vomiting, and diarrhea, severe with fever and  question left lower lobe pneumonia.   HISTORY OF PRESENT ILLNESS:  [name] [name] is a very pleasant 77 year old female  who resides at [facility] at [facility] who has had for less than 24 hours  the onset of nausea, vomiting, and diarrhea that has been quite severe.  She  has had multiple episodes of vomiting and diarrhea.  She is quite weak and  cannot walk and is febrile.  Her husband had a similar illness 3-5 days ago.  She cannot drink small amounts of liquids and her temperature is greater  than 101.  She is having chills.  She has had no melena, hematemesis, or  bright red blood per rectum.  Denies shortness of breath, chest pain,  headache, or neck stiffness.  She is admitted now for IV therapy and control  of symptoms.  Her chest x-ray does have a questionable left lower lobe  infiltrate.   ALLERGIES:  Question of FENTANYL patch causing a local skin reaction.  PENICILLIN has also caused a rash.  There is a question of a local reaction  to LIDODERM patch as well.   MEDICATIONS:  1.  Forteo one shot daily.  2.  Calcium carbonate 600 mg b.i.d.  3.  Neurontin 400 mg t.i.d.  4.  Mirapex 0.5 mg nightly.  5.  Hydrochlorothiazide 12.5 daily.  6.  Lorazepam 1 mg p.o. b.i.d. and p.r.n.  7.  Remeron 15 mg p.o. nightly.  8.  Synthroid 25 mcg daily.  9.  Norvasc 5 mg a day.  10. Lasix 20 mg a day.  11. Vicodin 10 mg - question extra strength one p.o. q.i.d. p.r.n.   PAST MEDICAL HISTORY:  1.  Hypertension.  2.  Severe osteoporosis with T8, T10, and T12  compression fractures with      thoracic kyphosis.  She has had multiple kyphoplasties.  3.  Restless leg syndrome.  4.  Hypothyroidism.  5.  History of paroxysmal atrial tachycardia.  6.  Past history of thrombocytosis.  7.  History of hiatal hernia without significant GERD.  8.  History of left putamen stroke approximately 4-1/2 years ago and at the      time had a partial expressive aphasia which has improved.  9.  Question of depression.  10. History of mild mitral regurgitation on two-dimensional echocardiogram      noted in April of 2006.  Ejection fraction at the time was 70% - within      normal limits.   PAST SURGICAL HISTORY:  1.  Bilateral mastectomies secondary to severe fibrocystic disease with      bilateral breast implants.  2.  Total abdominal  hysterectomy with bilateral salpingo-oophorectomy.  3.  Right shoulder surgery x3.   HEALTH MAINTENANCE:  Up-to-date.  Pneumovax in 2004.  Flu vaccine this year.   FAMILY HISTORY:  Noncontributory.   SOCIAL HISTORY:  The patient lives at [facility] at [place].  She is  married and has two supportive sons.  [name] [name] is the son who knows the  most about her medical situation and his numbers are [phone] and [phone].  The patient does not smoke or drink.   REVIEW OF SYSTEMS:  As per HPI.  Denies abdominal pain currently, but is  very weak.   PHYSICAL EXAMINATION:  GENERAL:  Elderly female in no acute distress.  VITAL SIGNS:  Blood pressure 159/93, temperature 101.3, pulse 104 and  regular, respiratory rate 26 and unlabored.  HEENT:  Normocephalic and atraumatic.  Pupils equal, round, and reactive to  light.  Extraocular movements intact.  Oropharynx is clear, moderately well  hydrated.  Tongue not shrunken or dry.  NECK:  Supple without JVD, obvious thyromegaly, nodularity, or  lymphadenopathy.  CHEST:  Clear to auscultation.  Could not hear breath sounds well in the  bases.  HEART:  No obvious murmur and no obvious  gallops.  Regular rhythm.  ABDOMEN:  Nondistended, mild increase in bowel sounds.  No rebound  tenderness to palpation.  No hepatomegaly or splenomegaly or mass noted.  Stool is brown and loose.  NEUROLOGY:  Oriented x3.  She is conversant, but lethargic.  She moves all  extremities well, nonfocal examination.   LABORATORY DATA:  White count 15,900 with 90% neutrophils, hemoglobin 16.2,  platelet count 258,000.  LFT's are normal. Sodium 142, potassium 4.1,  chloride 112, bicarb 18, BUN 31, creatinine 0.9, glucose 129.  Amylase and  lipase within normal limits - 89 and 19 respectively.  Urinalysis is normal  except for 100 mg/dl of protein.   Chest x-ray shows a questionable left lower lobe infiltrate.   Abdominal x-ray reveals no active disease.   ASSESSMENT:  A 77 year old female with nausea, vomiting, diarrhea, and  questionable left lower lobe infiltrate versus poor inspiration with  atelectasis.   PLAN:  1.  Nausea and vomiting, diarrhea.  Treat with IV fluids, IV Phenergan,      Imodium, and follow BMET.  Check Clostridium difficile toxin and stool      for enteric pathogens.  2.  Chronic back pain.  Add Dilaudid p.r.n. and then Vicodin when taking      p.o.  3.  Restless leg syndrome.  Continue Mirapex.  4.  Question of pneumonia.  Treat with IV Avelox and follow O2 saturations.      Use incentive spirometry and      consider nebulizers.  5.  Hypothyroidism -  continue Synthroid and check TSH.  6.  Question of depression.  Continue Remeron when taking p.o.      Candyce Churn, M.D.  Electronically Signed     RNG/MEDQ  D:  05/12/2005  T:  05/12/2005  Job:  [id number]   cc:   Hal T. Stoneking, M.D.  Fax: [phone]   Melissa L. Ladona Ridgel, MD

## 2010-08-20 NOTE — Discharge Summary (Signed)
NAME:  Mckissack, Hildreth                 ACCOUNT NO.:  [id number]   MEDICAL RECORD NO.:  [id number]          PATIENT TYPE:  INP   LOCATION:  5712                         FACILITY:  [facility]   PHYSICIAN:  Sherin Quarry, MD      DATE OF BIRTH:  Oct 04, 1923   DATE OF ADMISSION:  10/05/2005  DATE OF DISCHARGE:  10/08/2005                                 DISCHARGE SUMMARY   [name] [name] is an 77 year old lady who is a resident of a [facility],  where she lives in independent living section.  She initially presented to  [facility] on October 05, 2205, with a 48-hour history of fever  associated with cough productive of greenish phlegm, weakness, and mild  confusion.   Her past history is remarkable for hypertension as well as severe  osteoporosis with multiple vertebroplasties in the past.   PHYSICAL EXAMINATION:  Her initial physical exam, as described by Dr. Kirby Funk:  VITAL SIGNS:  Her temperature was 102, blood pressure 153/61, heart rate  111, respirations 24, O2 saturation 95%.  HEENT:  Within normal limits.  CHEST:  Revealed crackles at the bases.  CARDIOVASCULAR:  Showed normal S1-S2 without rubs, murmurs, or gallops.  ABDOMEN:  Benign.  There are normal bowel sounds without masses or  tenderness.  NEUROLOGIC:  Testing:  Cranial nerves, motor, sensory, and cerebellar  testing was normal.  Station and gait was not tested.   LABORATORY STUDIES:  The white count was 14,900, hemoglobin 12.8.  Potassium  was 4.0, creatinine 0.90, BUN 23.  Liver functions were normal.  Urinalysis  was negative.  Serial blood cultures were obtained and these are negative to  date.  A urine culture was apparently contaminated.  A chest x-ray showed  left perihilar infiltrate with bronchitic changes.   Therefore, the patient was admitted with a diagnosis of bronchitis and  pneumonia.  She was given an IV of D5 and half-normal saline with 10 mEq of  KCl at 100 cc/hour.  Dr. Valentina Lucks placed the  patient on Avelox 400 mg IV  every 24 hours and continued all of her other medications.  Her hospital  course was one of gradual improvement over the next few days.  Her appetite  seemed to return.  She became more alert.  She did not have any further  chest pain, cough, or respiratory difficulty.  By October 08, 2005, after  discussion with her son and husband, the decision was made to discharge her  back to the [facility].   DISCHARGE DIAGNOSES:  1.  Left perihilar pneumonia.  2.  Mild dehydration.  3.  History of hypertension.  4.  Osteoporosis.  5.  Hypothyroidism.  6.  Gastroesophageal reflux.  7.  History of stroke.  8.  History of asthma.  9.  Chronic pain treatment.   DISCHARGE MEDICATIONS:  The patient will be instructed continue all of her  usual medicines.  These apparently consist of:  1.  Calcium 600 mg b.i.d.  2.  Lasix 40 mg daily.  3.  Lidoderm patch.  4.  Neurontin  600 mg p.o. b.i.d.  5.  Mirapex 0.5 mg at bedtime.  6.  Aspirin 81 mg daily.  7.  Ativan 1 mg t.i.d. p.r.n. for anxiety.  8.  Remeron 15 mg at bedtime.  9.  Norvasc 5 mg daily.  10. Synthroid 25 mcg daily.  11. Hydrocodone 10 one every 6 hours p.r.n. for pain.  12. The patient was instructed to reduce her Lasix dose to 1 every other day      until she saw Dr. Pete Glatter back in the office.  13. She was also advised to take Avelox 400 mg p.o. x5 more days.   She will be going to Dr. Laverle Hobby office next week and I advised her to  be sure to be checked at that time.  .           ______________________________  Sherin Quarry, MD     SY/MEDQ  D:  10/08/2005  T:  10/08/2005  Job:  [id number]   cc:   Hal T. Stoneking, M.D.  Fax: [phone]

## 2010-08-20 NOTE — Discharge Summary (Signed)
[place]  Patient:    [name] [name], [name] [name] Visit Number: [id number] MRN: [id number]          Service Type: MED Location: 3000 [id number] Attending Physician:  Ginette Otto Dictated by:   Hal T. Stoneking, M.D. Admit Date:  02/06/2001 Disc. Date: 02/08/01   CC:         Genene Churn. Love, M.D.   Discharge Summary  ADMITTING DIAGNOSIS:  Transient ischemic attack.  DISCHARGE DIAGNOSES:  1. Transient ischemic attack, primarily presenting with difficulty with     speech and right facial droop.  History of old left basal gangliar     stroke seen on CT and MRI.  2. Restless leg.  3. Hypothyroidism.  4. Hypertension.  5. Allergic rhinitis.  6. Asthma.  7. Osteoporosis with T8, T10, T12 compression fractures, status post     vertebroplasty in December of 2001.  8. Past history of paroxysmal atrial tachycardia.  9. Fibrocystic breast disease. 10. Diverticulosis. 11. Thrombocytosis. 12. Hiatal hernia. 13. Iron deficiency anemia with negative esophagogastroduodenoscopy and     colonoscopy, March of 2002.  CONSULTANTS:  Dr. Genene Churn. Love, neurology.  BRIEF HISTORY:  [name] [name] is a very nice 77 year old white female who has been having some problems with her right shoulder, had upcoming shoulder redo surgery possibly, although this is being looked at.  On the evening prior to admission, she had noticed some trouble with her speech but decided to wait and watch and then at approximately 12:30 on the day of admission, she had difficulty with speech, son noted a right facial droop and was brought to the emergency room for evaluation.  PHYSICAL EXAMINATION:  Blood pressure 146/61, respiratory rate 18, temperature 97.3, pulse 90.  HEENT:  Unremarkable.  LUNGS:  Clear.  HEART:  Regular rate and rhythm without murmurs, rubs, or gallops.  ABDOMEN:  Soft.  No hepatosplenomegaly or masses palpated.  NEUROLOGIC:  She was alert.  She was oriented to the  month but thought it was the 7th, knew it was 2002, knew it was the fall of the year, knew she was in [facility], but in general conversation, she would have some word-finding problems.  Dr. Sandria Manly noted some slight right facial droop, otherwise, exam nonfocal.  LABORATORY DATA:  CT scan revealed a 1-cm subacute left basal gangliar infarct, felt to be old.  Pro time 12.8.  Hemoglobin 11.5, white count 12,400, platelet count 522,000. Sodium 134, potassium 3.9, creatinine 0.9, chloride 101, bicarb 23, total bilirubin 0.4.  HOSPITAL COURSE:  Patient was admitted to a telemetry bed and remained in normal sinus rhythm.  Blood pressures remained good, 126/68, O2 saturations of 95%.  She had a 2-D echocardiogram which was poor quality but no sources of emboli were seen.  She had an MRI of the brain which revealed atrophy, small vessel disease, ? acute left basal gangliar infarct with white matter infarct and no hemorrhage seen.  MRA revealed widespread intracranial atherosclerotic changes involving the middle cerebral artery branches, no significant proximal stenosis noted.  Carotid Dopplers revealed no evidence of significant plaque noted.  The MRI was reviewed by Dr. Deanna Artis. Hickling and felt that there was a left lateral putamen/ventrolateral thalamus body of caudate lesion, was not lacunar, felt to be too large.  Brain stem atrophy noted.  There was cutoff of the distal right vertebral, the right posterior cerebral artery not well-seen.  Dr. Sharene Skeans felt that continuing antiplatelet therapy and physical therapy, speech  therapy would be needed.  Patient is discharged to subacute rehab.  Physical therapy and speech therapy will be consulted.  MEDICATIONS AT DISCHARGE:  1. Pepcid 20 mg p.o. b.i.d.  2. Norco 7.5/325 mg b.i.d.  3. Norvasc 5 mg a day.  4. Synthroid 0.025 mg a day.  5. Premarin 0.625 mg a day.  6. Aspirin enteric coated 325 mg a day.  7. Ativan 1 mg p.o. b.i.d.  p.r.n. Dictated by:   Hal T. Stoneking, M.D. Attending Physician:  Ginette Otto DD:  02/08/01 TD:  02/08/01 Job: [id number]

## 2010-08-20 NOTE — Procedures (Signed)
[place]  Patient:    [name] [name], [name] [name]                          MRN: [id number] Proc. Date: 06/06/00 Attending:  Petra Kuba, M.D. CC:         Hal T. Stoneking, M.D.  Dolan Amen, M.D.   Procedure Report  PROCEDURE PERFORMED:  Colonoscopy.  ENDOSCOPIST:  Petra Kuba, M.D.  INDICATIONS FOR PROCEDURE:  Patient with iron deficiency anemia, bright red blood on toilet tissue only.  Consent was signed after risks, benefits, methods, and options were thoroughly discussed in the office with her and her husband.  MEDICATIONS USED:  Fentanyl 100 mcg, Versed 10 mg.  DESCRIPTION OF PROCEDURE:  Rectal inspection was pertinent for external hemorrhoids.  Digital exam was negative.  The video pediatric colonoscope was inserted and fairly easily advanced around the colon to the cecum.  There was severe melanosis coli throughout.  To advance to the cecum did require some pelvic pressure but no position changes.  The cecum was identified by the appendiceal orifice and the ileocecal valve.  The scope was slowly withdrawn. The prep was adequate.  There was minimal liquid stool that required washing and suctioning.  On slow withdrawal through the colon, other than a significant melanosis coli, a rare left-sided diverticulum was seen but no polypoid lesions, masses or other abnormalities were seen as we slowly withdrew back to the rectum.  Once back in the rectum, the scope was retroflexed, pertinent for some internal hemorrhoids.  Scope was straightened, air was withdrawn and scope removed.  The patient tolerated the procedure well.  There was no obvious immediate complication.  ENDOSCOPIC DIAGNOSIS: 1. Internal and external hemorrhoids. 2. Rare left-sided diverticula. 3. Severe melanosis coli throughout. 4. Otherwise within normal limits to the cecum.  PLAN:  Continue work-up with an esophagogastroduodenoscopy.  Consider repeat screening in five  years.  Consider per Dr. Pete Glatter a trial of either Miralax or Lactulose, ____________ , Sorbitol for her constipation if that becomes an issue and discussed laxative use with her. DD:  06/06/00 TD:  06/07/00 Job: [id number]

## 2011-01-11 LAB — COMPREHENSIVE METABOLIC PANEL
ALT: 17
ALT: 17
AST: 38 — ABNORMAL HIGH
AST: 28
Albumin: 2.5 — ABNORMAL LOW
Albumin: 1.8 — ABNORMAL LOW
Alkaline Phosphatase: 97
BUN: 3 — ABNORMAL LOW
CO2: 22
CO2: 26
Calcium: 7.8 — ABNORMAL LOW
Chloride: 101
Chloride: 113 — ABNORMAL HIGH
Creatinine, Ser: 0.55
GFR calc Af Amer: 42 — ABNORMAL LOW
GFR calc Af Amer: >60
GFR calc non Af Amer: >60
GFR calc non Af Amer: >60
Glucose, Bld: 93
Potassium: 3.1 — ABNORMAL LOW
Sodium: 132 — ABNORMAL LOW
Sodium: 137
Sodium: 141
Total Bilirubin: 0.9
Total Bilirubin: 0.8
Total Protein: 7.4

## 2011-01-11 LAB — BASIC METABOLIC PANEL
BUN: 22
BUN: 4 — ABNORMAL LOW
BUN: 6
BUN: 8
BUN: <1 — ABNORMAL LOW
CO2: 16 — ABNORMAL LOW
CO2: 25
CO2: 26
CO2: 26
CO2: 27
CO2: 27
Calcium: 8.7
Calcium: 7.8 — ABNORMAL LOW
Calcium: 8.4
Chloride: 109
Chloride: 101
Chloride: 102
Chloride: 104
Chloride: 106
Chloride: 110
Creatinine, Ser: 1.27 — ABNORMAL HIGH
Creatinine, Ser: 0.55
Creatinine, Ser: 0.55
Creatinine, Ser: 0.55
Creatinine, Ser: 0.57
Creatinine, Ser: 0.57
GFR calc Af Amer: 49 — ABNORMAL LOW
GFR calc Af Amer: >60
GFR calc Af Amer: >60
GFR calc non Af Amer: >60
GFR calc non Af Amer: >60
Glucose, Bld: 93
Glucose, Bld: 82
Glucose, Bld: 83
Glucose, Bld: 90
Potassium: 3.1 — ABNORMAL LOW
Potassium: 3.3 — ABNORMAL LOW
Potassium: 3.3 — ABNORMAL LOW
Potassium: 3.5
Sodium: 138
Sodium: 139

## 2011-01-11 LAB — CBC
HCT: 26.7 — ABNORMAL LOW
HCT: 27.3 — ABNORMAL LOW
HCT: 27.4 — ABNORMAL LOW
HCT: 27.5 — ABNORMAL LOW
HCT: 29.3 — ABNORMAL LOW
HCT: 32.8 — ABNORMAL LOW
Hemoglobin: 8.1 — ABNORMAL LOW
Hemoglobin: 8.8 — ABNORMAL LOW
Hemoglobin: 9.7 — ABNORMAL LOW
MCHC: 33.2
MCHC: 32.8
MCHC: 32.8
MCHC: 32.8
MCHC: 33
MCHC: 33
MCHC: 33.1
MCHC: 33.2
MCHC: 33.3
MCHC: 33.5
MCV: 87.1
MCV: 85.4
MCV: 85.4
MCV: 86.2
MCV: 86.5
MCV: 86.5
MCV: 86.6
MCV: 86.8
MCV: 87
Platelets: 459 — ABNORMAL HIGH
Platelets: 552 — ABNORMAL HIGH
Platelets: 601 — ABNORMAL HIGH
Platelets: 663 — ABNORMAL HIGH
Platelets: 683 — ABNORMAL HIGH
Platelets: 697 — ABNORMAL HIGH
Platelets: 877 — ABNORMAL HIGH
Platelets: 992
RBC: 2.86 — ABNORMAL LOW
RBC: 2.9 — ABNORMAL LOW
RBC: 3.06 — ABNORMAL LOW
RBC: 3.13 — ABNORMAL LOW
RBC: 3.14 — ABNORMAL LOW
RBC: 3.43 — ABNORMAL LOW
RDW: 20.3 — ABNORMAL HIGH
RDW: 20.6 — ABNORMAL HIGH
RDW: 15.7 — ABNORMAL HIGH
RDW: 15.7 — ABNORMAL HIGH
RDW: 16.4 — ABNORMAL HIGH
RDW: 16.5 — ABNORMAL HIGH
RDW: 16.5 — ABNORMAL HIGH
RDW: 16.6 — ABNORMAL HIGH
RDW: 17.1 — ABNORMAL HIGH
WBC: 29.7 — ABNORMAL HIGH
WBC: 11.8 — ABNORMAL HIGH
WBC: 16.1 — ABNORMAL HIGH
WBC: 8.7

## 2011-01-11 LAB — URINE CULTURE
Colony Count: NO GROWTH
Colony Count: NO GROWTH
Culture: NO GROWTH

## 2011-01-11 LAB — URINALYSIS, ROUTINE W REFLEX MICROSCOPIC
Bilirubin Urine: NEGATIVE
Bilirubin Urine: NEGATIVE
Glucose, UA: NEGATIVE
Glucose, UA: NEGATIVE
Ketones, ur: NEGATIVE
Ketones, ur: NEGATIVE
Ketones, ur: NEGATIVE
Leukocytes, UA: NEGATIVE
Nitrite: NEGATIVE
Nitrite: NEGATIVE
Nitrite: NEGATIVE
Protein, ur: 30 — AB
Protein, ur: 30 — AB
Protein, ur: NEGATIVE
Protein, ur: NEGATIVE
Specific Gravity, Urine: 1.018
Urobilinogen, UA: 0.2
Urobilinogen, UA: 0.2
Urobilinogen, UA: 1
pH: 8

## 2011-01-11 LAB — POCT I-STAT 3, ART BLOOD GAS (G3+)
Operator id: 290171
Patient temperature: 98.7
TCO2: 13
pCO2 arterial: 20.3 — ABNORMAL LOW
pH, Arterial: 7.411 — ABNORMAL HIGH

## 2011-01-11 LAB — DIFFERENTIAL
Basophils Relative: 0
Basophils Relative: 0
Basophils Relative: 1
Eosinophils Absolute: 0 — ABNORMAL LOW
Eosinophils Absolute: 0.1
Eosinophils Absolute: 0.2
Eosinophils Relative: 0
Eosinophils Relative: 1
Eosinophils Relative: 1
Lymphs Abs: 1.1
Monocytes Absolute: 0.3
Monocytes Relative: 17 — ABNORMAL HIGH
Neutro Abs: 28.5 — ABNORMAL HIGH
Neutrophils Relative %: 74
Neutrophils Relative %: 82 — ABNORMAL HIGH

## 2011-01-11 LAB — CULTURE, BLOOD (ROUTINE X 2)
Culture: NO GROWTH
Culture: NO GROWTH

## 2011-01-11 LAB — D-DIMER, QUANTITATIVE: D-Dimer, Quant: 3.95 — ABNORMAL HIGH

## 2011-01-11 LAB — I-STAT 8, (EC8 V) (CONVERTED LAB)
BUN: 17
Bicarbonate: 24.4 — ABNORMAL HIGH
Glucose, Bld: 118 — ABNORMAL HIGH
Operator id: 294511
pCO2, Ven: 41.1 — ABNORMAL LOW
pH, Ven: 7.381 — ABNORMAL HIGH

## 2011-01-11 LAB — PROTIME-INR
INR: 2.2 — ABNORMAL HIGH
INR: 2.6 — ABNORMAL HIGH
INR: 1.4
INR: 1.6 — ABNORMAL HIGH
INR: 2.4 — ABNORMAL HIGH
INR: 2.7 — ABNORMAL HIGH
INR: 2.8 — ABNORMAL HIGH
Prothrombin Time: 25 — ABNORMAL HIGH
Prothrombin Time: 17.2 — ABNORMAL HIGH
Prothrombin Time: 19.2 — ABNORMAL HIGH
Prothrombin Time: 20.7 — ABNORMAL HIGH
Prothrombin Time: 25.5 — ABNORMAL HIGH
Prothrombin Time: 26.5 — ABNORMAL HIGH
Prothrombin Time: 30.1 — ABNORMAL HIGH

## 2011-01-11 LAB — B-NATRIURETIC PEPTIDE (CONVERTED LAB)
Pro B Natriuretic peptide (BNP): 427 — ABNORMAL HIGH
Pro B Natriuretic peptide (BNP): 511 — ABNORMAL HIGH

## 2011-01-11 LAB — VITAMIN D 1,25 DIHYDROXY: Vit D, 1,25-Dihydroxy: 39 pg/mL (ref 6–62)

## 2011-01-11 LAB — PREALBUMIN: Prealbumin: 7.2 — ABNORMAL LOW

## 2011-01-11 LAB — VITAMIN D 25 HYDROXY (VIT D DEFICIENCY, FRACTURES): Vit D, 25-Hydroxy: 42 (ref 30–89)

## 2011-01-11 LAB — PATHOLOGIST SMEAR REVIEW

## 2011-01-11 LAB — HEPARIN LEVEL (UNFRACTIONATED)
Heparin Unfractionated: 0.18 — ABNORMAL LOW
Heparin Unfractionated: 0.19 — ABNORMAL LOW
Heparin Unfractionated: 0.7

## 2011-01-11 LAB — URINE MICROSCOPIC-ADD ON

## 2011-01-11 LAB — LACTIC ACID, PLASMA: Lactic Acid, Venous: 2.6 — ABNORMAL HIGH

## 2011-01-11 LAB — CLOSTRIDIUM DIFFICILE EIA

## 2011-01-12 LAB — BASIC METABOLIC PANEL
BUN: 15
BUN: 16
BUN: 6
BUN: 8
BUN: 8
BUN: 9
BUN: 6
CO2: 23
CO2: 23
CO2: 27
CO2: 28
CO2: 28
CO2: 29
CO2: 26
Calcium: 7.9 — ABNORMAL LOW
Calcium: 8 — ABNORMAL LOW
Calcium: 8.1 — ABNORMAL LOW
Calcium: 8.3 — ABNORMAL LOW
Chloride: 103
Chloride: 105
Chloride: 109
Chloride: 89 — ABNORMAL LOW
Chloride: 98
Chloride: 99
Chloride: 98
Creatinine, Ser: 0.43
Creatinine, Ser: 0.49
Creatinine, Ser: 0.5
Creatinine, Ser: 0.5
Creatinine, Ser: 0.55
Creatinine, Ser: 0.65
GFR calc Af Amer: >60
GFR calc Af Amer: >60
GFR calc Af Amer: >60
GFR calc non Af Amer: >60
GFR calc non Af Amer: >60
GFR calc non Af Amer: >60
Glucose, Bld: 111 — ABNORMAL HIGH
Glucose, Bld: 125 — ABNORMAL HIGH
Glucose, Bld: 89
Glucose, Bld: 90
Glucose, Bld: 93
Glucose, Bld: 93
Glucose, Bld: 95
Glucose, Bld: 134 — ABNORMAL HIGH
Potassium: 3.3 — ABNORMAL LOW
Potassium: 3.5
Potassium: 3.6
Potassium: 3.9
Potassium: 3.2 — ABNORMAL LOW
Sodium: 130 — ABNORMAL LOW

## 2011-01-12 LAB — CBC
HCT: 23.9 — ABNORMAL LOW
HCT: 27 — ABNORMAL LOW
HCT: 28.1 — ABNORMAL LOW
HCT: 28.7 — ABNORMAL LOW
HCT: 31.7 — ABNORMAL LOW
HCT: 34.2 — ABNORMAL LOW
HCT: 36.6
HCT: 29.6 — ABNORMAL LOW
Hemoglobin: 10.4 — ABNORMAL LOW
Hemoglobin: 11 — ABNORMAL LOW
Hemoglobin: 11.8 — ABNORMAL LOW
Hemoglobin: 12.1
Hemoglobin: 9.4 — ABNORMAL LOW
MCHC: 32.8
MCHC: 33.1
MCHC: 33.4
MCHC: 33.5
MCHC: 33.6
MCHC: 33.6
MCHC: 33.9
MCHC: 33.4
MCV: 81.6
MCV: 83.5
MCV: 85.3
MCV: 85.6
MCV: 86.3
MCV: 87.1
MCV: 87.3
MCV: 81.1
Platelets: 324
Platelets: 338
Platelets: 372
Platelets: 383
Platelets: 451 — ABNORMAL HIGH
Platelets: 544 — ABNORMAL HIGH
Platelets: 662 — ABNORMAL HIGH
Platelets: 487 — ABNORMAL HIGH
RBC: 3.1 — ABNORMAL LOW
RBC: 3.2 — ABNORMAL LOW
RBC: 4.1
RBC: 4.2
RDW: 15.4 — ABNORMAL HIGH
RDW: 15.4 — ABNORMAL HIGH
RDW: 15.5 — ABNORMAL HIGH
RDW: 15.6 — ABNORMAL HIGH
RDW: 15.7 — ABNORMAL HIGH
RDW: 15.8 — ABNORMAL HIGH
RDW: 16.7 — ABNORMAL HIGH
RDW: 19 — ABNORMAL HIGH
WBC: 10.3
WBC: 11.5 — ABNORMAL HIGH
WBC: 14.6 — ABNORMAL HIGH
WBC: 15.4 — ABNORMAL HIGH
WBC: 18 — ABNORMAL HIGH

## 2011-01-12 LAB — CROSSMATCH: Antibody Screen: NEGATIVE

## 2011-01-12 LAB — TSH: TSH: 3.174

## 2011-01-12 LAB — PROTIME-INR
INR: 1.3
INR: 1.6 — ABNORMAL HIGH
INR: 1.7 — ABNORMAL HIGH
INR: 1.8 — ABNORMAL HIGH
INR: 2.5 — ABNORMAL HIGH
Prothrombin Time: 16.2 — ABNORMAL HIGH
Prothrombin Time: 18.9 — ABNORMAL HIGH
Prothrombin Time: 19.6 — ABNORMAL HIGH
Prothrombin Time: 20.1 — ABNORMAL HIGH
Prothrombin Time: 21.3 — ABNORMAL HIGH
Prothrombin Time: 26.2 — ABNORMAL HIGH
Prothrombin Time: 27.7 — ABNORMAL HIGH

## 2011-01-12 LAB — DIFFERENTIAL
Basophils Relative: 0
Eosinophils Absolute: 0
Eosinophils Relative: 0
Lymphs Abs: 0.9

## 2011-01-12 LAB — TYPE AND SCREEN
ABO/RH(D): O POS
Antibody Screen: NEGATIVE

## 2011-01-12 LAB — ABO/RH: ABO/RH(D): O POS

## 2011-01-17 LAB — CBC
HCT: 26.7 — ABNORMAL LOW
HCT: 27.2 — ABNORMAL LOW
HCT: 29.8 — ABNORMAL LOW
HCT: 32.3 — ABNORMAL LOW
HCT: 33.7 — ABNORMAL LOW
Hemoglobin: 10.6 — ABNORMAL LOW
Hemoglobin: 10.7 — ABNORMAL LOW
Hemoglobin: 9.2 — ABNORMAL LOW
Hemoglobin: 9.4 — ABNORMAL LOW
Hemoglobin: 9.5 — ABNORMAL LOW
MCHC: 33.1
MCHC: 33.6
MCHC: 33.6
MCHC: 33.9
MCHC: 34
MCHC: 34.1
MCV: 83.5
MCV: 83.8
MCV: 84.5
MCV: 84.6
MCV: 84.8
MCV: 84.8
Platelets: 741 — ABNORMAL HIGH
Platelets: 745 — ABNORMAL HIGH
Platelets: 760 — ABNORMAL HIGH
Platelets: 807 — ABNORMAL HIGH
Platelets: 848 — ABNORMAL HIGH
Platelets: 852 — ABNORMAL HIGH
Platelets: 869 — ABNORMAL HIGH
Platelets: 871 — ABNORMAL HIGH
Platelets: 878 — ABNORMAL HIGH
Platelets: 884 — ABNORMAL HIGH
Platelets: 893 — ABNORMAL HIGH
Platelets: 937
Platelets: 988
RBC: 3.24 — ABNORMAL LOW
RBC: 3.38 — ABNORMAL LOW
RBC: 3.58 — ABNORMAL LOW
RBC: 3.72 — ABNORMAL LOW
RDW: 13.5
RDW: 14.5 — ABNORMAL HIGH
RDW: 14.6 — ABNORMAL HIGH
RDW: 14.6 — ABNORMAL HIGH
RDW: 14.8 — ABNORMAL HIGH
RDW: 14.8 — ABNORMAL HIGH
RDW: 15.2 — ABNORMAL HIGH
RDW: 15.3 — ABNORMAL HIGH
RDW: 15.3 — ABNORMAL HIGH
WBC: 12 — ABNORMAL HIGH
WBC: 12.1 — ABNORMAL HIGH
WBC: 12.8 — ABNORMAL HIGH
WBC: 13 — ABNORMAL HIGH
WBC: 13.2 — ABNORMAL HIGH
WBC: 13.8 — ABNORMAL HIGH
WBC: 15.3 — ABNORMAL HIGH
WBC: 15.7 — ABNORMAL HIGH

## 2011-01-17 LAB — PT FACTOR INHIBITOR (MIXING STUDY)
Patient-1/1, Immediate Mix-PT: 15.2
Patient-4/1, Immediate Mix-PT: 16.4

## 2011-01-17 LAB — DIC (DISSEMINATED INTRAVASCULAR COAGULATION) PANEL (NOT AT ARMC)
D-Dimer, Quant: 3.82 — ABNORMAL HIGH
Fibrinogen: 465
Platelets: 886 — ABNORMAL HIGH
Prothrombin Time: 17.1 — ABNORMAL HIGH
Smear Review: NONE SEEN

## 2011-01-17 LAB — BASIC METABOLIC PANEL
BUN: 11
BUN: 11
BUN: 11
BUN: 8
CO2: 22
Calcium: 8 — ABNORMAL LOW
Calcium: 8.1 — ABNORMAL LOW
Calcium: 8.1 — ABNORMAL LOW
Chloride: 96
Creatinine, Ser: 0.46
Creatinine, Ser: 0.47
GFR calc non Af Amer: >60
GFR calc non Af Amer: >60
GFR calc non Af Amer: >60
Glucose, Bld: 124 — ABNORMAL HIGH
Glucose, Bld: 96
Glucose, Bld: 98
Potassium: 4.2
Potassium: 4.2

## 2011-01-17 LAB — PROTIME-INR
INR: 1.2
INR: 1.3
INR: 1.4
INR: 1.4
INR: 1.5
INR: 1.5
INR: 1.6 — ABNORMAL HIGH
INR: 1.6 — ABNORMAL HIGH
Prothrombin Time: 16.3 — ABNORMAL HIGH
Prothrombin Time: 16.6 — ABNORMAL HIGH
Prothrombin Time: 17.3 — ABNORMAL HIGH
Prothrombin Time: 17.4 — ABNORMAL HIGH
Prothrombin Time: 17.6 — ABNORMAL HIGH
Prothrombin Time: 18 — ABNORMAL HIGH
Prothrombin Time: 18.4 — ABNORMAL HIGH
Prothrombin Time: 18.6 — ABNORMAL HIGH
Prothrombin Time: 19.2 — ABNORMAL HIGH
Prothrombin Time: 19.3 — ABNORMAL HIGH

## 2011-01-17 LAB — APTT
aPTT: 32
aPTT: 36

## 2011-01-17 LAB — CLOSTRIDIUM DIFFICILE EIA

## 2011-01-17 LAB — HEPATIC FUNCTION PANEL
ALT: 18
AST: 22
Albumin: 2 — ABNORMAL LOW
Alkaline Phosphatase: 75
Bilirubin, Direct: 0.1
Indirect Bilirubin: 0.3
Total Bilirubin: 0.4
Total Protein: 5.9 — ABNORMAL LOW

## 2011-01-17 LAB — PTT FACTOR INHIBITOR (MIXING STUDY)
1 Hr Incub APTT 4:1NP: 41
1 Hr Incub PT 1:1NP: 37

## 2011-01-17 LAB — APTT MIXING STUDY SCREEN: aPTT Baseline: 39 — ABNORMAL HIGH

## 2011-01-17 LAB — PT MIXING STUDY SCREEN: PT Baseline: 18.2 — ABNORMAL HIGH

## 2011-01-17 LAB — DIC (DISSEMINATED INTRAVASCULAR COAGULATION)PANEL
INR: 1.4
aPTT: 49 — ABNORMAL HIGH

## 2011-01-18 LAB — PROTIME-INR
INR: 1.6 — ABNORMAL HIGH
INR: 1.7 — ABNORMAL HIGH
Prothrombin Time: 20.4 — ABNORMAL HIGH

## 2011-01-18 LAB — BODY FLUID CELL COUNT WITH DIFFERENTIAL
Neutrophil Count, Fluid: 52 — ABNORMAL HIGH
Total Nucleated Cell Count, Fluid: 1260 — ABNORMAL HIGH

## 2011-01-18 LAB — CARDIAC PANEL(CRET KIN+CKTOT+MB+TROPI)
CK, MB: 1.3
CK, MB: 10.7 — ABNORMAL HIGH
CK, MB: 11 — ABNORMAL HIGH
CK, MB: 17.6 — ABNORMAL HIGH
CK, MB: 4.4 — ABNORMAL HIGH
Relative Index: 1.1
Relative Index: 1.3
Relative Index: 1.7
Relative Index: 1.9
Total CK: 108
Total CK: 817 — ABNORMAL HIGH
Troponin I: 0.06
Troponin I: 0.07 — ABNORMAL HIGH

## 2011-01-18 LAB — BASIC METABOLIC PANEL
BUN: 11
BUN: 12
CO2: 27
Calcium: 7.9 — ABNORMAL LOW
Calcium: 8.3 — ABNORMAL LOW
Calcium: 8.5
Chloride: 104
Chloride: 95 — ABNORMAL LOW
Chloride: 97
Creatinine, Ser: 0.79
GFR calc Af Amer: >60
GFR calc Af Amer: >60
GFR calc Af Amer: >60
GFR calc Af Amer: >60
GFR calc non Af Amer: >60
GFR calc non Af Amer: >60
GFR calc non Af Amer: >60
GFR calc non Af Amer: >60
GFR calc non Af Amer: >60
Glucose, Bld: 103 — ABNORMAL HIGH
Glucose, Bld: 113 — ABNORMAL HIGH
Potassium: 2.6 — CL
Potassium: 3.7
Potassium: 4.1
Sodium: 128 — ABNORMAL LOW
Sodium: 130 — ABNORMAL LOW
Sodium: 130 — ABNORMAL LOW
Sodium: 130 — ABNORMAL LOW
Sodium: 133 — ABNORMAL LOW

## 2011-01-18 LAB — CULTURE, BLOOD (ROUTINE X 2): Culture: NO GROWTH

## 2011-01-18 LAB — CBC
HCT: 21.7 — ABNORMAL LOW
HCT: 31.5 — ABNORMAL LOW
HCT: 32.6 — ABNORMAL LOW
HCT: 33.6 — ABNORMAL LOW
HCT: 34.1 — ABNORMAL LOW
Hemoglobin: 10.5 — ABNORMAL LOW
Hemoglobin: 10.8 — ABNORMAL LOW
Hemoglobin: 10.8 — ABNORMAL LOW
Hemoglobin: 11.1 — ABNORMAL LOW
Hemoglobin: 11.2 — ABNORMAL LOW
Hemoglobin: 11.5 — ABNORMAL LOW
Hemoglobin: 11.7 — ABNORMAL LOW
MCHC: 34.1
MCHC: 34.3
MCV: 84.2
MCV: 85.4
Platelets: 336
Platelets: 382
Platelets: 392
RBC: 3.36 — ABNORMAL LOW
RBC: 3.61 — ABNORMAL LOW
RBC: 3.64 — ABNORMAL LOW
RBC: 3.85 — ABNORMAL LOW
RBC: 3.9
RBC: 3.94
RBC: 4.04
RDW: 12.3
RDW: 13.5
RDW: 14
RDW: 14.2 — ABNORMAL HIGH
WBC: 14 — ABNORMAL HIGH
WBC: 14.4 — ABNORMAL HIGH
WBC: 15.9 — ABNORMAL HIGH
WBC: 16 — ABNORMAL HIGH

## 2011-01-18 LAB — MAGNESIUM
Magnesium: 1.7
Magnesium: 1.8

## 2011-01-18 LAB — DIFFERENTIAL
Basophils Absolute: 0
Eosinophils Relative: 0
Lymphocytes Relative: 7 — ABNORMAL LOW
Lymphocytes Relative: 9 — ABNORMAL LOW
Lymphs Abs: 1.1
Lymphs Abs: 1.3
Monocytes Absolute: 1.1 — ABNORMAL HIGH
Monocytes Absolute: 2.1 — ABNORMAL HIGH
Monocytes Relative: 7
Neutro Abs: 10.8 — ABNORMAL HIGH

## 2011-01-18 LAB — COMPREHENSIVE METABOLIC PANEL
ALT: 14
AST: 31
Albumin: 3.4 — ABNORMAL LOW
Alkaline Phosphatase: 65
Chloride: 97
Creatinine, Ser: 0.66
GFR calc Af Amer: >60
Potassium: 3.3 — ABNORMAL LOW
Sodium: 131 — ABNORMAL LOW
Total Bilirubin: 0.8

## 2011-01-18 LAB — URINALYSIS, ROUTINE W REFLEX MICROSCOPIC
Nitrite: NEGATIVE
Protein, ur: NEGATIVE
Specific Gravity, Urine: 1.017
Urobilinogen, UA: 0.2

## 2011-01-18 LAB — TYPE AND SCREEN
ABO/RH(D): O POS
Antibody Screen: NEGATIVE

## 2011-01-18 LAB — LACTATE DEHYDROGENASE: LDH: 180

## 2011-01-18 LAB — PATHOLOGIST SMEAR REVIEW: Tech Review: NEGATIVE

## 2011-01-18 LAB — APTT: aPTT: 45 — ABNORMAL HIGH

## 2011-01-18 LAB — TSH: TSH: 2.427

## 2011-01-18 LAB — BODY FLUID CULTURE: Culture: NO GROWTH

## 2011-01-18 LAB — URINE CULTURE
Colony Count: NO GROWTH
Culture: NO GROWTH

## 2011-01-18 LAB — HEMOGLOBIN AND HEMATOCRIT, BLOOD: HCT: 29.5 — ABNORMAL LOW

## 2011-01-18 LAB — POCT CARDIAC MARKERS: Troponin i, poc: <0.05

## 2011-01-18 LAB — PHOSPHORUS: Phosphorus: 2.1 — ABNORMAL LOW

## 2015-11-26 ENCOUNTER — Telehealth: Payer: Self-pay | Admitting: Acute Care

## 2015-11-26 NOTE — Telephone Encounter (Signed)
Referral was canceled on 11/26/15 for the lung cancer screening program due to pt exceeding age requirement. I sent notification to Dr. Sherilyn DacostaStock's office via fax. Per medical records form can not be scanned into chart.

## 2015-12-10 ENCOUNTER — Encounter: Payer: Self-pay | Admitting: Acute Care
